# Patient Record
Sex: Female | Born: 1965 | Race: White | Hispanic: No | Marital: Married | Smoking: Never smoker
Health system: Southern US, Community
[De-identification: ages and names within clinical notes are randomized; demographics above are authoritative.]

## PROBLEM LIST (undated history)

## (undated) DIAGNOSIS — M1990 Unspecified osteoarthritis, unspecified site: Secondary | ICD-10-CM

## (undated) DIAGNOSIS — E039 Hypothyroidism, unspecified: Secondary | ICD-10-CM

## (undated) DIAGNOSIS — R112 Nausea with vomiting, unspecified: Secondary | ICD-10-CM

## (undated) DIAGNOSIS — C801 Malignant (primary) neoplasm, unspecified: Secondary | ICD-10-CM

## (undated) DIAGNOSIS — D649 Anemia, unspecified: Secondary | ICD-10-CM

## (undated) DIAGNOSIS — F32A Depression, unspecified: Secondary | ICD-10-CM

## (undated) DIAGNOSIS — I499 Cardiac arrhythmia, unspecified: Secondary | ICD-10-CM

## (undated) DIAGNOSIS — J45909 Unspecified asthma, uncomplicated: Secondary | ICD-10-CM

## (undated) DIAGNOSIS — Z87442 Personal history of urinary calculi: Secondary | ICD-10-CM

## (undated) DIAGNOSIS — F329 Major depressive disorder, single episode, unspecified: Secondary | ICD-10-CM

## (undated) DIAGNOSIS — E079 Disorder of thyroid, unspecified: Secondary | ICD-10-CM

## (undated) DIAGNOSIS — R519 Headache, unspecified: Secondary | ICD-10-CM

## (undated) DIAGNOSIS — F419 Anxiety disorder, unspecified: Secondary | ICD-10-CM

## (undated) DIAGNOSIS — T4145XA Adverse effect of unspecified anesthetic, initial encounter: Secondary | ICD-10-CM

## (undated) DIAGNOSIS — K219 Gastro-esophageal reflux disease without esophagitis: Secondary | ICD-10-CM

## (undated) DIAGNOSIS — G4730 Sleep apnea, unspecified: Secondary | ICD-10-CM

## (undated) DIAGNOSIS — R51 Headache: Secondary | ICD-10-CM

## (undated) DIAGNOSIS — Z9889 Other specified postprocedural states: Secondary | ICD-10-CM

## (undated) HISTORY — DX: Anxiety disorder, unspecified: F41.9

## (undated) HISTORY — DX: Unspecified asthma, uncomplicated: J45.909

## (undated) HISTORY — DX: Depression, unspecified: F32.A

## (undated) HISTORY — DX: Anemia, unspecified: D64.9

## (undated) HISTORY — PX: TONSILLECTOMY AND ADENOIDECTOMY: SHX28

## (undated) HISTORY — PX: BREAST SURGERY: SHX581

## (undated) HISTORY — DX: Disorder of thyroid, unspecified: E07.9

## (undated) HISTORY — PX: COSMETIC SURGERY: SHX468

## (undated) HISTORY — DX: Major depressive disorder, single episode, unspecified: F32.9

## (undated) HISTORY — PX: TONSILLECTOMY: SUR1361

## (undated) HISTORY — PX: ABDOMINAL HYSTERECTOMY: SHX81

---

## 1988-08-13 HISTORY — PX: BREAST IMPLANT REMOVAL: SUR1101

## 1998-06-07 ENCOUNTER — Other Ambulatory Visit: Admission: RE | Admit: 1998-06-07 | Discharge: 1998-06-07 | Payer: Self-pay | Admitting: Plastic Surgery

## 1998-08-13 HISTORY — PX: BREAST IMPLANT EXCHANGE: SHX6296

## 1999-03-14 ENCOUNTER — Other Ambulatory Visit: Admission: RE | Admit: 1999-03-14 | Discharge: 1999-03-14 | Payer: Self-pay | Admitting: Family Medicine

## 2000-03-19 ENCOUNTER — Other Ambulatory Visit: Admission: RE | Admit: 2000-03-19 | Discharge: 2000-03-19 | Payer: Self-pay | Admitting: Family Medicine

## 2001-03-31 ENCOUNTER — Other Ambulatory Visit: Admission: RE | Admit: 2001-03-31 | Discharge: 2001-03-31 | Payer: Self-pay | Admitting: Family Medicine

## 2002-03-19 ENCOUNTER — Emergency Department (HOSPITAL_COMMUNITY): Admission: EM | Admit: 2002-03-19 | Discharge: 2002-03-19 | Payer: Self-pay | Admitting: Emergency Medicine

## 2002-03-19 ENCOUNTER — Encounter: Payer: Self-pay | Admitting: Emergency Medicine

## 2002-03-23 ENCOUNTER — Emergency Department (HOSPITAL_COMMUNITY): Admission: EM | Admit: 2002-03-23 | Discharge: 2002-03-23 | Payer: Self-pay | Admitting: Emergency Medicine

## 2002-03-25 ENCOUNTER — Other Ambulatory Visit: Admission: RE | Admit: 2002-03-25 | Discharge: 2002-03-25 | Payer: Self-pay | Admitting: *Deleted

## 2002-03-27 ENCOUNTER — Encounter: Payer: Self-pay | Admitting: Family Medicine

## 2002-03-27 ENCOUNTER — Encounter: Admission: RE | Admit: 2002-03-27 | Discharge: 2002-03-27 | Payer: Self-pay | Admitting: Family Medicine

## 2003-03-02 ENCOUNTER — Emergency Department (HOSPITAL_COMMUNITY): Admission: AD | Admit: 2003-03-02 | Discharge: 2003-03-02 | Payer: Self-pay

## 2003-03-04 ENCOUNTER — Inpatient Hospital Stay (HOSPITAL_COMMUNITY): Admission: AD | Admit: 2003-03-04 | Discharge: 2003-03-05 | Payer: Self-pay | Admitting: Urology

## 2003-03-04 ENCOUNTER — Encounter: Payer: Self-pay | Admitting: Urology

## 2003-04-08 ENCOUNTER — Other Ambulatory Visit: Admission: RE | Admit: 2003-04-08 | Discharge: 2003-04-08 | Payer: Self-pay | Admitting: *Deleted

## 2004-12-08 ENCOUNTER — Emergency Department (HOSPITAL_COMMUNITY): Admission: EM | Admit: 2004-12-08 | Discharge: 2004-12-08 | Payer: Self-pay | Admitting: Emergency Medicine

## 2004-12-20 ENCOUNTER — Ambulatory Visit (HOSPITAL_COMMUNITY): Admission: RE | Admit: 2004-12-20 | Discharge: 2004-12-20 | Payer: Self-pay | Admitting: *Deleted

## 2005-01-24 ENCOUNTER — Ambulatory Visit (HOSPITAL_COMMUNITY): Admission: RE | Admit: 2005-01-24 | Discharge: 2005-01-24 | Payer: Self-pay | Admitting: Urology

## 2005-08-13 HISTORY — PX: ABDOMINAL EXPLORATION SURGERY: SHX538

## 2006-06-25 ENCOUNTER — Ambulatory Visit (HOSPITAL_COMMUNITY): Admission: RE | Admit: 2006-06-25 | Discharge: 2006-06-25 | Payer: Self-pay | Admitting: Gastroenterology

## 2006-06-28 ENCOUNTER — Ambulatory Visit (HOSPITAL_COMMUNITY): Admission: RE | Admit: 2006-06-28 | Discharge: 2006-06-28 | Payer: Self-pay | Admitting: Gastroenterology

## 2006-09-23 ENCOUNTER — Emergency Department (HOSPITAL_COMMUNITY): Admission: EM | Admit: 2006-09-23 | Discharge: 2006-09-24 | Payer: Self-pay | Admitting: Emergency Medicine

## 2007-03-29 ENCOUNTER — Emergency Department (HOSPITAL_COMMUNITY): Admission: EM | Admit: 2007-03-29 | Discharge: 2007-03-29 | Payer: Self-pay | Admitting: Emergency Medicine

## 2007-05-09 ENCOUNTER — Inpatient Hospital Stay (HOSPITAL_COMMUNITY): Admission: EM | Admit: 2007-05-09 | Discharge: 2007-05-12 | Payer: Self-pay | Admitting: Emergency Medicine

## 2007-08-12 ENCOUNTER — Emergency Department (HOSPITAL_COMMUNITY): Admission: EM | Admit: 2007-08-12 | Discharge: 2007-08-12 | Payer: Self-pay | Admitting: *Deleted

## 2007-10-25 IMAGING — CT CT ABDOMEN W/ CM
1 of 2 series · 15 of 32 positions shown, 19 images · IV contrast (omnipaque)
Comparison: None.

CLINICAL DATA: 41 year old female with abdominal pain. nausea, and vomiting.
ABDOMEN CT WITH CONTRAST ? 05/09/07:
TECHNIQUE: Multidetector CT imaging of the abdomen was performed following the standard protocol during bolus administration of intravenous contrast. 
Contrast:  100 ml Omnipaque 300 IV.
TECHNIQUE: Multidetector CT imaging of the pelvis was performed following the standard protocol during bolus administration of intravenous contrast.

[Series 2: abd_pel 5.0 b40f st · axial · 0.64mm/px · z∈[-456,-26]mm · 15 of 96 slices shown, 19 images]
[im 5/96  soft-tissue]
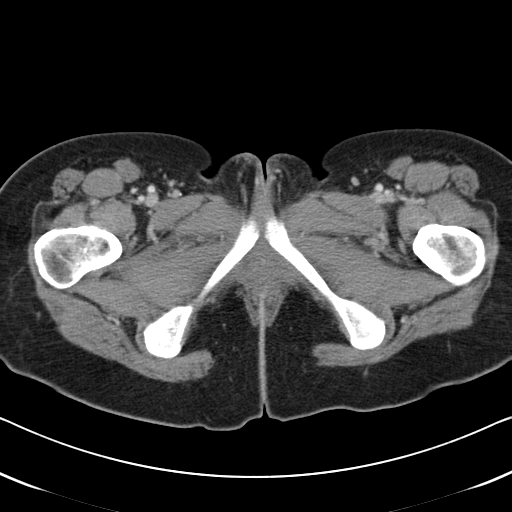
[im 5/96  bone]
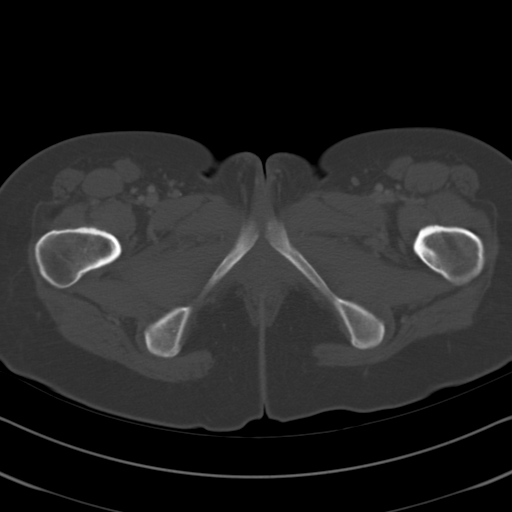
[im 13/96  soft-tissue]
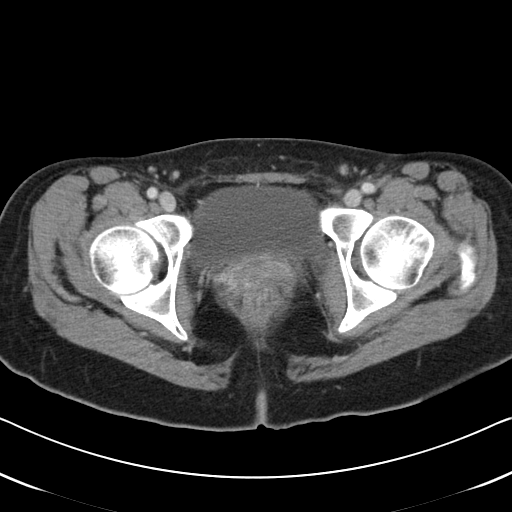
[im 22/96  soft-tissue]
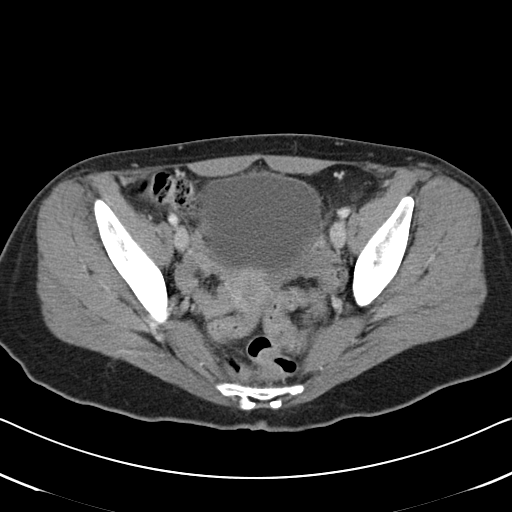
[im 26/96  soft-tissue]
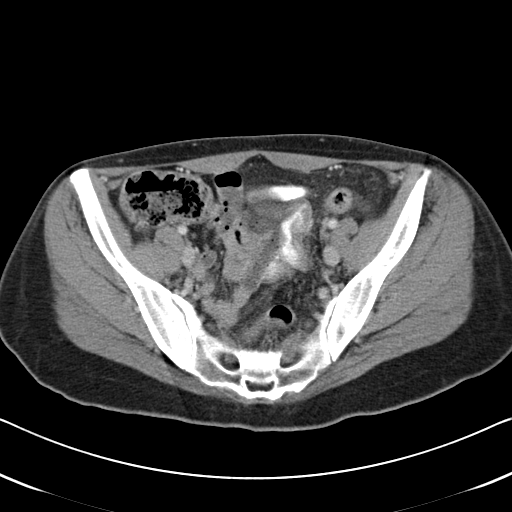
[im 35/96  soft-tissue]
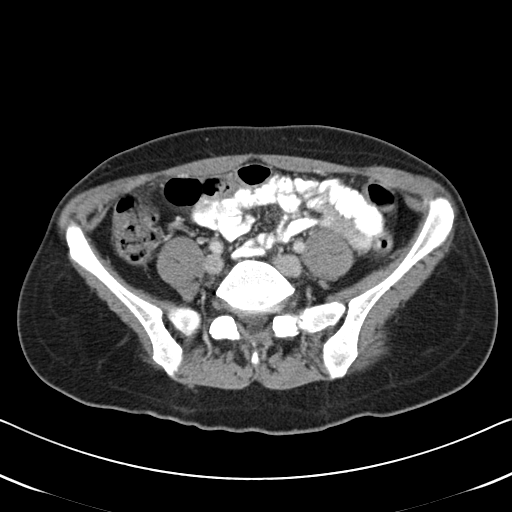
[im 39/96  soft-tissue]
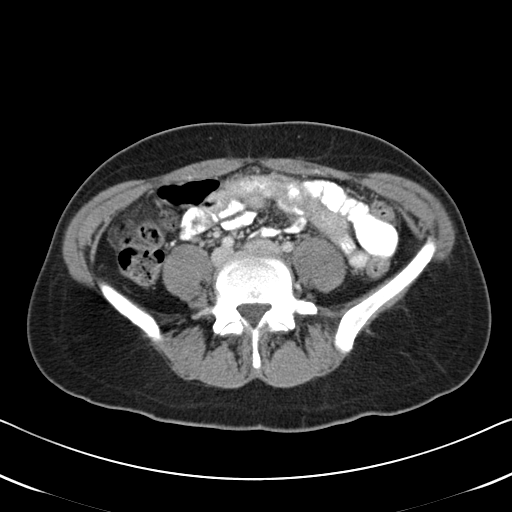
[im 48/96  soft-tissue]
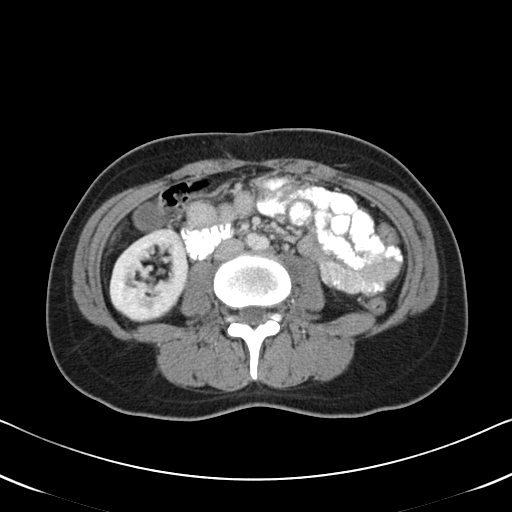
[im 57/96  soft-tissue]
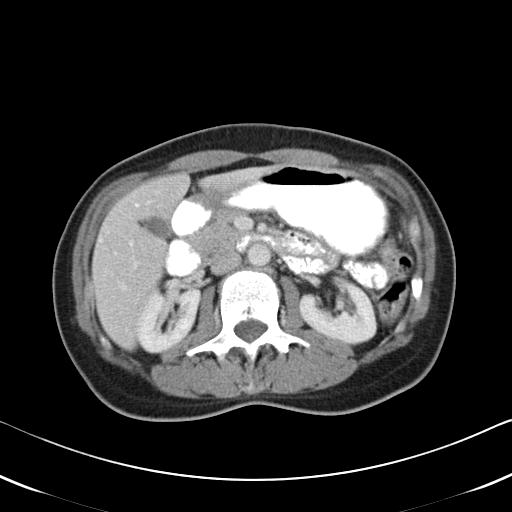
[im 61/96  soft-tissue]
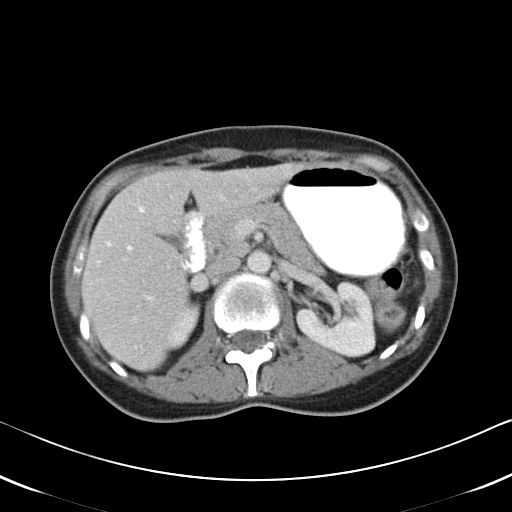
[im 61/96  bone]
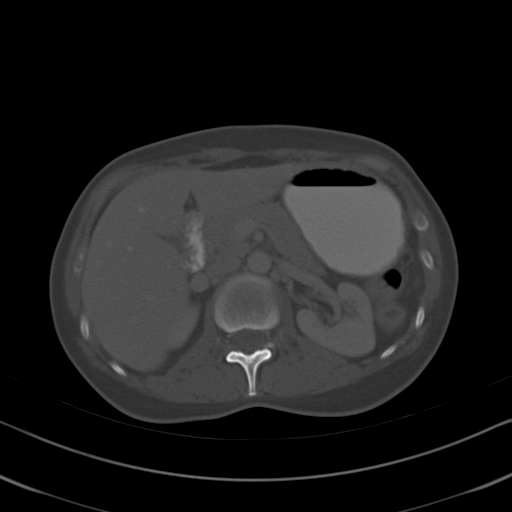
[im 70/96  soft-tissue]
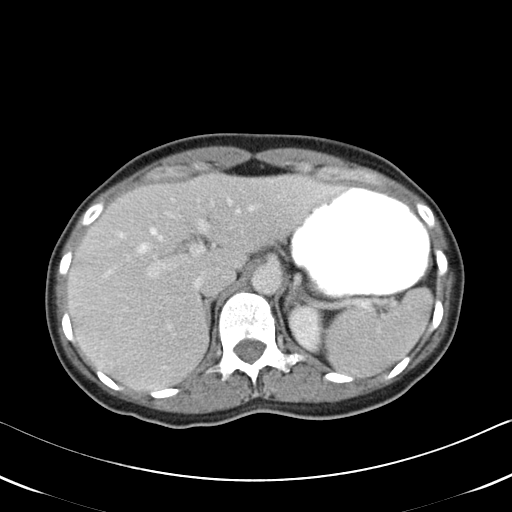
[im 74/96  soft-tissue]
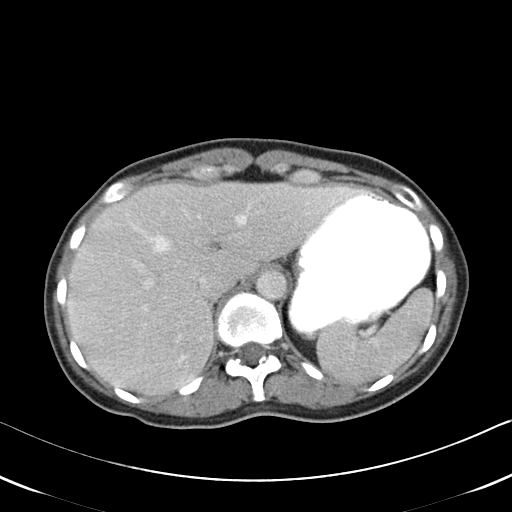
[im 78/96  lung]
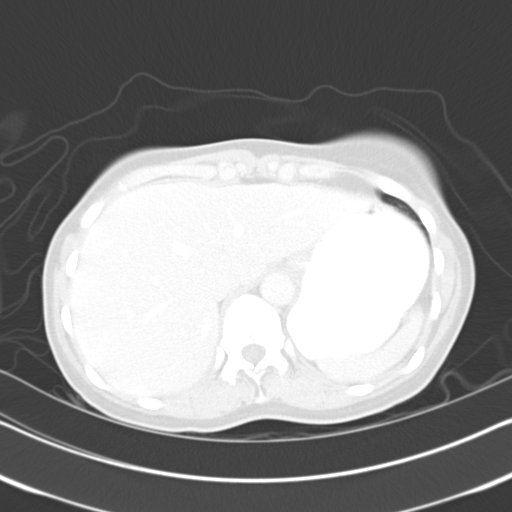
[im 83/96  soft-tissue]
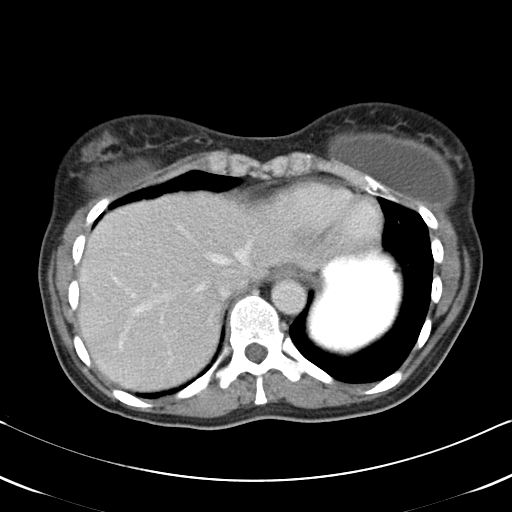
[im 83/96  lung]
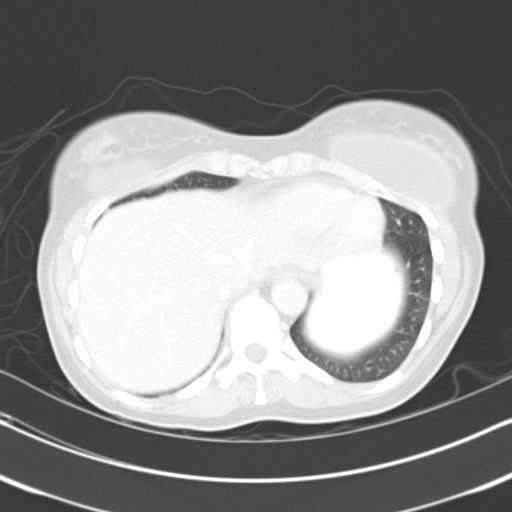
[im 87/96  lung]
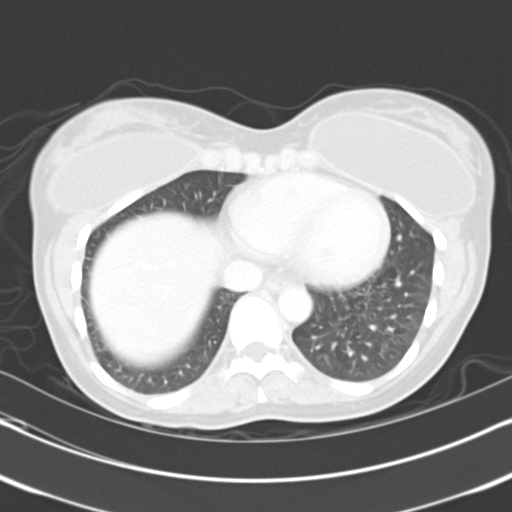
[im 91/96  soft-tissue]
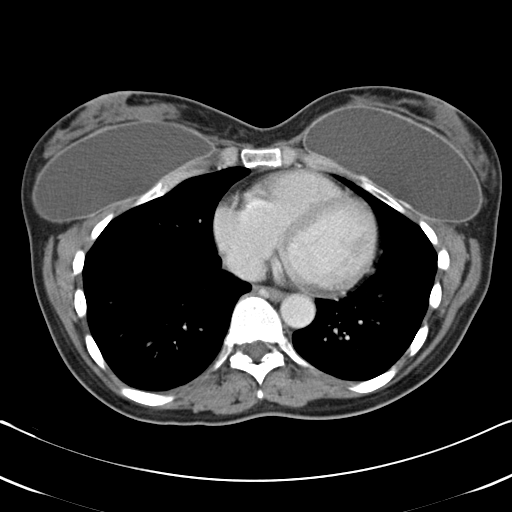
[im 91/96  lung]
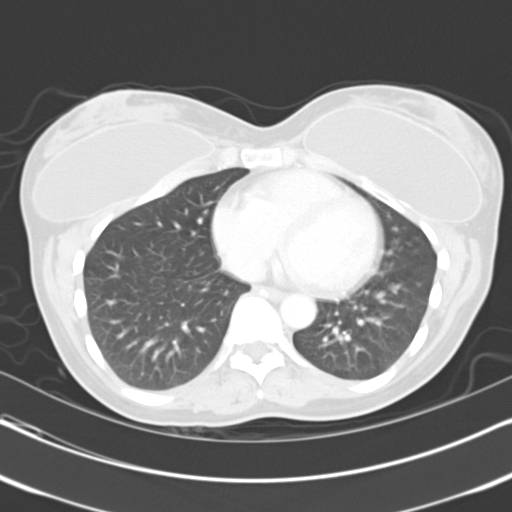

[15 of 32 positions shown; findings below may reference images not displayed]

FINDINGS: The lung bases are clear.  The heart size is normal.   The infused appearance of the liver is within normal limits.  The spleen is unremarkable.  The pancreas, common bile duct, and gallbladder are within normal limits.  The adrenal glands are unremarkable.  The kidneys are within normal limits on both the portal venous and delayed images.  There is no significant abdominal lymphadenopathy or free fluid.
Bone windows demonstrate minimal degenerative change.
IMPRESSION: 1.  No acute abnormality of the abdomen.
2.  Minimal degenerative change of the thoracolumbar spine with Schmorl's node at T12 and L1.
PELVIS CT WITH CONTRAST ? 05/09/07:
FINDINGS: The rectosigmoid colon is within normal limits. The uterus and adnexa are unremarkable. The urinary bladder is within normal limits.  There is mild wall thickening the small bowel without a focal lesion or obstruction.  This may represent inflammatory change or merely underdistention.
IMPRESSION: 1.  Mild nonspecific inflammatory change of the proximal small bowel.  Viral enteritis is not excluded.  
2.  No evidence for obstruction.
3.  No focal mass lesion.

## 2009-07-15 ENCOUNTER — Ambulatory Visit (HOSPITAL_COMMUNITY): Admission: RE | Admit: 2009-07-15 | Discharge: 2009-07-15 | Payer: Self-pay | Admitting: Internal Medicine

## 2010-09-04 ENCOUNTER — Other Ambulatory Visit: Payer: Self-pay | Admitting: Internal Medicine

## 2010-09-04 DIAGNOSIS — Z139 Encounter for screening, unspecified: Secondary | ICD-10-CM

## 2010-09-15 ENCOUNTER — Ambulatory Visit (HOSPITAL_COMMUNITY): Payer: Self-pay

## 2010-09-18 ENCOUNTER — Other Ambulatory Visit: Payer: Self-pay | Admitting: Internal Medicine

## 2010-09-18 DIAGNOSIS — Z1231 Encounter for screening mammogram for malignant neoplasm of breast: Secondary | ICD-10-CM

## 2010-09-18 DIAGNOSIS — Z1239 Encounter for other screening for malignant neoplasm of breast: Secondary | ICD-10-CM

## 2010-10-06 ENCOUNTER — Ambulatory Visit (HOSPITAL_COMMUNITY): Payer: Self-pay

## 2010-10-06 ENCOUNTER — Ambulatory Visit (HOSPITAL_COMMUNITY): Admission: RE | Admit: 2010-10-06 | Payer: Self-pay | Source: Ambulatory Visit

## 2010-10-31 ENCOUNTER — Ambulatory Visit (HOSPITAL_COMMUNITY)
Admission: RE | Admit: 2010-10-31 | Discharge: 2010-10-31 | Disposition: A | Discharge status: Home / Self Care | Payer: Self-pay | Source: Ambulatory Visit | Attending: Internal Medicine | Admitting: Internal Medicine

## 2010-10-31 DIAGNOSIS — Z1231 Encounter for screening mammogram for malignant neoplasm of breast: Secondary | ICD-10-CM | POA: Insufficient documentation

## 2010-12-26 NOTE — Op Note (Signed)
[name], Lynn White                 ACCOUNT NO.:  [id number]   MEDICAL RECORD NO.:  [id number]          PATIENT TYPE:  INP   LOCATION:  1432                         FACILITY:  [facility]   PHYSICIAN:  Anselmo Rod, M.D.  DATE OF BIRTH:  Jun 03, 1966   DATE OF PROCEDURE:  05/11/2007  DATE OF DISCHARGE:                               OPERATIVE REPORT   PROCEDURE PERFORMED:  Esophagogastroduodenoscopy.   ENDOSCOPIST:  Anselmo Rod, M.D..   INSTRUMENT USED:  Pentax video panendoscope.   INDICATIONS FOR PROCEDURE:  A 45 year old white female with history of  severe epigastric pain, undergoing EGD to rule out peptic ulcer disease,  esophagitis, gastritis, etc.   PREPROCEDURE PREPARATION:  Informed consent was procured from the  patient.  The patient was fasted for 8 hours prior to the procedure.  The risks and benefits of the procedure were discussed with the patient  in great detail.   PREPROCEDURE PHYSICAL:  The patient had stable vital signs.  NECK:  Supple.  CHEST:  Clear to auscultation.  S1, S2 regular.  ABDOMEN:  Soft with normal bowel sounds.   DESCRIPTION OF PROCEDURE:  The patient was placed in the left lateral  decubitus position and sedated with 100 mcg of Fentanyl and 6 mg of  Versed, given intravenously in slow incremental doses. Once the patient  was adequately sedated and maintained on low-flow oxygen and continuous  cardiac monitoring, the Pentax video panendoscope was advanced through  the mouthpiece over the tongue and into the esophagus under direct  vision. The entire esophagus was widely patent with no evidence of ring,  stricture, mass, esophagitis or Barrett's mucosa. The vocal cords  appeared normal.  The Z-line appeared healthy. The scope was then  advanced into the stomach. Retroflexion of the high cardia revealed no  abnormalities.  The mid body appeared normal.  Mild antral gastritis was  noted. The proximal small bowel appeared normal.  There was no  obvious  obstruction.   IMPRESSION:  Mild antral gastritis, otherwise normal EGD.   RECOMMENDATIONS:  1. Continue PPIs.  2. Outpatient follow-up with me within the next week.      Anselmo Rod, M.D.  Electronically Signed     JNM/MEDQ  D:  05/11/2007  T:  05/11/2007  Job:  [id number]   cc:   Milagros Evener, M.D.  Fax: [phone]

## 2010-12-26 NOTE — Discharge Summary (Signed)
Lynn White, WELLEN                 ACCOUNT NO.:  [id number]   MEDICAL RECORD NO.:  [id number]          PATIENT TYPE:  INP   LOCATION:  1432                         FACILITY:  [facility]   PHYSICIAN:  Madaline Savage, MD        DATE OF BIRTH:  1966-02-19   DATE OF ADMISSION:  05/09/2007  DATE OF DISCHARGE:  05/13/2007                               DISCHARGE SUMMARY   PRIMARY CARE PHYSICIAN:  None.   FINAL DISCHARGE DIAGNOSES:  1. Abdominal pain of uncertain etiology.  2. Generalized anxiety.  3. Hypothyroidism.  4. Chronic pain syndrome.   DISCHARGE MEDICATIONS:  1. Levothyroxine 100 mcg once daily.  2. Protonix 40 mg once daily.  3. Align 1 tablet daily.  4. Klonopin 1 mg every 6 hours as needed.  5. Propranolol 20 mg 3 times daily as needed.  6. Tylenol 500 mg 3 times daily as needed.   HISTORY OF PRESENT ILLNESS:  For the full history and physical, see the  history and physical dictated by Dr. Renae Gloss on May 09, 2007.  In  short, Lynn White is a 45 year old lady with several-year history of  severe abdominal pain.  She has been evaluated multiple times by her gyn  doctor, and she had a flexible sigmoidoscopy in 2006 which was  unremarkable.  She now comes in with worsening abdominal pain for  further evaluation and management.   PROCEDURES PERFORMED IN THE HOSPITAL:  1. She had an EGD done by Dr. Loreta Ave on May 11, 2007 which showed      mild antral gastritis, otherwise normal EGD.  2. She had CT of the abdomen and pelvis done on May 09, 2007,      which showed no acute abnormality of the abdomen, mild nonspecific      inflammatory change of the proximal small bowel, viral enteritis      not excluded.  3. She had an ultrasound abdomen done on May 09, 2007 which      showed no acute abnormality.   PROBLEM LIST:  1. Abdominal pain.  Lynn White has abdominal pain for years which has      been evaluated in the past.  This time, we admitted her for further    evaluation and management, and started treating her with IV fluids      and pain medications.  She has never had an EGD.  We consulted Dr.      Loreta Ave for an EGD and an EGD was done which showed no abnormality      except antral gastritis.  The plan is to continue her on proton      pump inhibitor at this time.  She also had a CT of the abdomen and      pelvis which showed questionable enteritis.  Dr. Loreta Ave does not      think this a true finding at this time.  She also had an ultrasound      abdomen which was unremarkable.  Her lipase was within normal      limits.  Her urine HCG was  negative.  At this time, Dr. Loreta Ave is      thinking this is probably a functional abdominal pain, and is      recommending that she follow up with her psychiatrist, Dr. Evelene Croon, in      1 week.  2. Hypothyroidism.  She is on thyroid replacement, and we did a TSH on      her which was elevated at 5.8.  I increased her Synthroid from 75      mcg to 100 mcg.  She will need a followup TSH in 4-6 weeks.   DISPOSITION:  She will be discharged home in a stable condition.   FOLLOWUP:  She is asked to follow up with Dr. Loreta Ave in 2 weeks and also  Dr. Evelene Croon in 1 week.      Madaline Savage, MD  Electronically Signed     PKN/MEDQ  D:  05/12/2007  T:  05/12/2007  Job:  [id number]

## 2010-12-26 NOTE — H&P (Signed)
[name], [name]                 ACCOUNT NO.:  [id number]   MEDICAL RECORD NO.:  [id number]          PATIENT TYPE:  INP   LOCATION:  1432                         FACILITY:  [facility]   PHYSICIAN:  Merlene Laughter. Renae Gloss, M.D.DATE OF BIRTH:  [birth year]   DATE OF ADMISSION:  05/09/2007  DATE OF DISCHARGE:                              HISTORY & PHYSICAL   CHIEF COMPLAINT:  Severe abdominal pain.   HISTORY OF PRESENT ILLNESS:  [name] is a 45 year old lady with a  several-year history of severe abdominal pain.  This has been evidently  evaluated several times in the past per her GYN physician, Dr. Marina Gravel, who performed a laparoscopic procedure on her in 2006, which was  unremarkable for endometriosis.  She also appears to have had a limited  GI evaluation last year including a flexible sigmoidoscopy per Dr. Loreta Ave,  which was unremarkable except hemorrhoids..  In the event, she continues  to have such significant abdominal pain that she has been unable to work  and has, in fact, lost her job last year due to days absent as a result  of her abdominal symptoms.  She states that she had been taking Tylenol  for pain which, of course, has not been helpful, but 2 weeks ago her  mother-in-law died and she found in her possession some morphine 15 mg,  which she has been taking as well.  She also states the morphine has not  been helpful.  However, she has noted constipation from taking this  narcotic.  She has mild nausea but no vomiting.  She appears to be  tolerating solids and liquids without complications.  She denies fever  or chills.   She has no primary care physician and she has no health insurance since  she lost her job a year ago.  However, she is followed by her  gynecologist, Dr. Marina Gravel, and her psychiatrist, Dr. Milagros Evener.   PAST MEDICAL HISTORY:  1. Generalized anxiety.  2. Hypothyroidism.  3. Chronic abdominal pain, unclear etiology.   FAMILY HISTORY:   Significant for breast cancer in mother, who is a 34-  year survivor.   SOCIAL HISTORY:  Currently unemployed.  She denies tobacco or drugs of  abuse.  She has an occasional glass of one.   DRUG ALLERGIES:  No known drug allergies.   MEDICATIONS:  1. Klonopin 1 mg p.o. t.i.d.  2. Lyre daily as directed.  3. Propranolol 20 mg p.o. t.i.d. p.r.n. for anxiety.  4. Align p.r.n.  5. Levothyroxine 75 mcg p.o. daily.   REVIEW OF SYSTEMS:  As per HPI.  Otherwise negative.  Greater than 10  systems were reviewed.   PHYSICAL EXAMINATION:  GENERAL APPEARANCE:  A well-developed, well-  nourished female complaining of abdominal pain.  Temperature 98.6, pulse 75, respirations 18, blood pressure 98/65, O2  saturation on room air 98%.  HEENT: No oropharyngeal lesions.  NECK:  Supple.  No masses.  Carotids 2+, no bruits.  LUNGS:  Clear to auscultation bilaterally.  HEART:  S1-S2, regular rate and rhythm.  No murmurs, rubs or  gallops.  ABDOMEN:  Soft, diffuse tenderness but especially in the epigastric  area.  No rebound tenderness or guarding.  She has normoactive bowel  sounds.  EXTREMITIES:  No clubbing, cyanosis or edema.  Pulses 2+ throughout.  NEUROLOGIC:  Alert and oriented x3.  Cranial nerves intact.   LABORATORY DATA:  Lipase 45, 31, 39.  Potassium 3.7.  BUN 8, creatinine  0.84.  Liver function tests are normal.  WBC 5.7, hemoglobin 11.7,  platelet 306.  Urine pregnancy test is negative.  Abdominal ultrasound  is negative for gallstones.  Abdominal-pelvic CT scan shows mild  inflammatory change in the proximal small bowel, questionable for viral  enteritis.   ASSESSMENT/PLAN:  Abdominal pain.  The etiology is unclear, however may  be due to the CT findings of viral enteritis.  Therefore, we will  continue supportive management with IV fluids and pain control.  Given  that she has had such a chronic symptomatology, she will require further  GI evaluation during this admission.  Other  etiologies to consider would  include peptic ulcer disease, severe GERD, biliary disease.           ______________________________  Merlene Laughter. Renae Gloss, M.D.     KRS/MEDQ  D:  05/09/2007  T:  05/10/2007  Job:  [id number]

## 2010-12-29 NOTE — Op Note (Signed)
Lynn White, Lynn White                 ACCOUNT NO.:  [id number]   MEDICAL RECORD NO.:  [id number]          PATIENT TYPE:  AMB   LOCATION:  ENDO                         FACILITY:  [facility]   PHYSICIAN:  Anselmo Rod, M.D.  DATE OF BIRTH:  September 12, 1965   DATE OF PROCEDURE:  06/28/2006  DATE OF DISCHARGE:                                 OPERATIVE REPORT   PROCEDURE PERFORMED:  Flexible sigmoidoscopy to 90 cm.   ENDOSCOPIST:  Anselmo Rod, M.D.   INSTRUMENT USED:  Olympus video colonoscope.   INDICATIONS FOR PROCEDURE:  45 year old white female with a history of  severe left lower quadrant pain undergoing a flexible sigmoidoscopy.  The  patient had worsening constipation the recent past, rule out colonic polyps,  masses, etc.   PREPROCEDURE PREPATION:  Informed consent was procured from the patient.  The patient fasted for eight hours prior to the procedure and prepped with a  gallon of Trilyte the night prior to the procedure.  Risks and benefits of  the procedure including a 10% miss rate of cancer and polyp were discussed  with the patient as well.   PREPROCEDURE PHYSICAL:  Patient had stable vital signs.  Neck supple, chest  clear to auscultation.  S1, S2 regular.  Abdomen soft with severe left lower  quadrant tenderness on palpation with guarding, no rebound or rigidity.   DESCRIPTION OF PROCEDURE:  The patient was placed in the left lateral  decubitus position and sedated with 100 mcg of fentanyl and 10 mg of Versed  along with a 12.5 mg of Phenergan.  Once the patient was adequately sedated  and maintained on low-flow oxygen and continuous cardiac monitoring, the  Olympus video colonoscope was advanced from the rectum to 90 cm without  difficulty.  The patient had significant abdominal discomfort with  insufflation of air into the colon indicating a component of visceral  hypersensitivity consistent with ISB.  Small internal hemorrhoids seen on  retroflexion.  The rest of exam  was unremarkable.  No masses or polyps  identified. There was no evidence of diverticulosis.   IMPRESSION:  1. Small nonbleeding internal hemorrhoids otherwise normal exam to 90 cm.  2. Visceral hypersensitivity appreciated with insufflation of air into the      colon indicating a component of IBS.   RECOMMENDATIONS:  1. Continue high fiber diet with liberal fluid intake.  2. Outpatient follow-up in the next 2 weeks for further recommendations.  3. Baseline screening colonoscopy is advised at age 36.      Anselmo Rod, M.D.  Electronically Signed     JNM/MEDQ  D:  07/01/2006  T:  07/01/2006  Job:  [id number]   cc:   Gerri Spore B. Earlene Plater, M.D.

## 2010-12-29 NOTE — Op Note (Signed)
Lynn White, Lynn White                 ACCOUNT NO.:  [id number]   MEDICAL RECORD NO.:  [id number]          PATIENT TYPE:  AMB   LOCATION:  SDC                           FACILITY:  [facility]   PHYSICIAN:  Nash B. Earlene Plater, M.D.  DATE OF BIRTH:  [birth year]   DATE OF PROCEDURE:  12/20/2004  DATE OF DISCHARGE:                                 OPERATIVE REPORT   PREOPERATIVE DIAGNOSES:  Pelvic pain and dysmenorrhea.   POSTOPERATIVE DIAGNOSES:  Pelvic pain and dysmenorrhea.   PROCEDURE:  Open diagnostic laparoscopy.   SURGEON:  Chester Holstein. Earlene Plater, M.D.   ASSISTANT:  None.   ANESTHESIA:  General.   ESTIMATED BLOOD LOSS:  Minimal.   COMPLICATIONS:  None.   FINDINGS:  Normal-appearing uterus, tubes, ovaries, pelvis and upper  abdomen.  The appendix was not well seen and presumed to be retrocecal.   INDICATIONS:  The patient with a greater than one year history of pelvic  pain and dysmenorrhea which has been severe.  She has been to the emergency  room on one occasion recently and needed oral Dilaudid to control her pain.  Recent pelvic ultrasound showed no abnormality.  Given the severe and  persistent nature of her pain, the patient presents for a diagnostic  laparoscopy and potential Lynn White dioxide laser vaporization of endometriosis  if identified.  The patient was advised of an approximately 30% chance of  identifying the endometriosis, a 30% chance of no pathology, 30% chance of  adhesive disease or other abnormality.  The risks of surgery were discussed  included infection, bleeding or damage to surrounding organs.   DESCRIPTION OF PROCEDURE:  The patient was taken to the operating room and  general anesthesia was obtained.  She was placed in the Kihei stirrups and  prepped and draped in the standard fashion.  The Foley catheter was inserted  into the bladder.  The speculum was inserted.  A single-toothed tenaculum  was attached to the anterior lip of the cervix.  The cervical stenosis  was  encountered and would not allow passage of the Hulka tenaculum or a dilator.  Therefore, the single-toothed was left attached for uterine manipulation.   The 10 mm incision was placed at the umbilicus and carried sharply to the  fascia.  The fascia was divided sharply and elevated with Kocher clamps.  The posterior sheath and peritoneum were elevated with an Allis clamp and  entered sharply.  A pursestring suture of 0 Vicryl was placed around the  fascial defect.  The Hasson cannula was inserted and secured.  The  pneumoperitoneum was obtained with CO2 gas.  The Trendelenburg position was  obtained.  Bowel mobilized superiorly with blunt probes through the  operating scope.  The pelvis and abdomen were inspected with the above  findings noted.  Given no pathology was found, the procedure was terminated.   The scope was removed and gas released.  The abdominal wall was elevated  with a [employer] retractor and the previously placed pursestring suture  snugged down.  There was an approximately 0.25 cm defect in the fascia at  the inferior aspect.  With the abdominal wall elevated with the previously  placed suture, a superficial stitch was placed through the defect to  reapproximate it.  The skin was closed with subcuticular 4-0 Vicryl.  The  single-toothed  was removed and the cervix hemostatic.   The patient tolerated the procedure well and there were no complications.  She was taken to the recovery room awake, alert and in stable condition.      WBD/MEDQ  D:  12/20/2004  T:  12/20/2004  Job:  [id number]

## 2011-05-18 LAB — COMPREHENSIVE METABOLIC PANEL
BUN: 4 — ABNORMAL LOW
CO2: 26
GFR calc non Af Amer: >60
Potassium: 3.8
Sodium: 138
Total Bilirubin: 0.5

## 2011-05-18 LAB — URINALYSIS, ROUTINE W REFLEX MICROSCOPIC
Bilirubin Urine: NEGATIVE
Glucose, UA: NEGATIVE
Ketones, ur: NEGATIVE
Leukocytes, UA: NEGATIVE
Protein, ur: NEGATIVE
Specific Gravity, Urine: 1.008

## 2011-05-18 LAB — URINE MICROSCOPIC-ADD ON

## 2011-05-18 LAB — RAPID URINE DRUG SCREEN, HOSP PERFORMED
Amphetamines: NOT DETECTED
Barbiturates: NOT DETECTED
Tetrahydrocannabinol: NOT DETECTED

## 2011-05-18 LAB — TRICYCLICS SCREEN, URINE: TCA Scrn: POSITIVE — AB

## 2011-05-24 LAB — CBC
HCT: 32.3 — ABNORMAL LOW
MCHC: 34.7
Platelets: 276
Platelets: 279
RBC: 3.62 — ABNORMAL LOW
RBC: 3.66 — ABNORMAL LOW
RDW: 12.8
RDW: 13
RDW: 13.1
WBC: 5.7
WBC: 7.5

## 2011-05-24 LAB — COMPREHENSIVE METABOLIC PANEL
ALT: 18
Albumin: 3.3 — ABNORMAL LOW
Alkaline Phosphatase: 66
Calcium: 9.2
Creatinine, Ser: 0.84
GFR calc Af Amer: >60
GFR calc non Af Amer: >60
Total Bilirubin: 0.4

## 2011-05-24 LAB — DIFFERENTIAL
Basophils Relative: 0
Monocytes Absolute: 0.2
Monocytes Absolute: 0.3
Monocytes Relative: 4
Monocytes Relative: 4
Neutro Abs: 2.7
Neutro Abs: 4.3
Neutrophils Relative %: 47
Neutrophils Relative %: 63

## 2011-05-24 LAB — BASIC METABOLIC PANEL
BUN: 1 — ABNORMAL LOW
CO2: 27
Chloride: 107
Potassium: 3.8

## 2011-05-24 LAB — URINALYSIS, ROUTINE W REFLEX MICROSCOPIC
Ketones, ur: NEGATIVE
Leukocytes, UA: NEGATIVE
Protein, ur: NEGATIVE

## 2011-05-24 LAB — URINE MICROSCOPIC-ADD ON

## 2011-05-25 LAB — URINALYSIS, ROUTINE W REFLEX MICROSCOPIC
Nitrite: NEGATIVE
Protein, ur: NEGATIVE
Specific Gravity, Urine: 1.02
Urobilinogen, UA: 0.2

## 2011-05-25 LAB — URINE CULTURE

## 2011-05-25 LAB — PREGNANCY, URINE: Preg Test, Ur: NEGATIVE

## 2011-05-25 LAB — COMPREHENSIVE METABOLIC PANEL
Albumin: 3.4 — ABNORMAL LOW
Alkaline Phosphatase: 52
BUN: 8
Calcium: 9.3
Chloride: 109
Creatinine, Ser: 0.83
GFR calc Af Amer: >60
GFR calc non Af Amer: >60

## 2011-05-25 LAB — URINE MICROSCOPIC-ADD ON

## 2011-05-25 LAB — DIFFERENTIAL
Basophils Relative: 0
Eosinophils Absolute: 0.2
Eosinophils Relative: 2
Monocytes Relative: 3
Neutrophils Relative %: 63

## 2011-05-25 LAB — CBC
HCT: 34.7 — ABNORMAL LOW
MCHC: 34.7
MCV: 93.3
Platelets: 283
RDW: 14.2 — ABNORMAL HIGH
WBC: 6.6

## 2011-10-19 ENCOUNTER — Ambulatory Visit (INDEPENDENT_AMBULATORY_CARE_PROVIDER_SITE_OTHER): Payer: Self-pay | Admitting: Family Medicine

## 2011-10-19 ENCOUNTER — Encounter: Payer: Self-pay | Admitting: Family Medicine

## 2011-10-19 DIAGNOSIS — E039 Hypothyroidism, unspecified: Secondary | ICD-10-CM

## 2011-10-19 DIAGNOSIS — N926 Irregular menstruation, unspecified: Secondary | ICD-10-CM

## 2011-10-19 DIAGNOSIS — N951 Menopausal and female climacteric states: Secondary | ICD-10-CM

## 2011-10-19 DIAGNOSIS — R1013 Epigastric pain: Secondary | ICD-10-CM

## 2011-10-19 LAB — POCT URINE PREGNANCY: Preg Test, Ur: NEGATIVE

## 2011-10-19 LAB — COMPREHENSIVE METABOLIC PANEL
ALT: <8 U/L (ref 0–35)
AST: 14 U/L (ref 0–37)
CO2: 27 mEq/L (ref 19–32)
Calcium: 9.4 mg/dL (ref 8.4–10.5)
Chloride: 103 mEq/L (ref 96–112)
Creat: 0.94 mg/dL (ref 0.50–1.10)
Sodium: 138 mEq/L (ref 135–145)
Total Protein: 6.8 g/dL (ref 6.0–8.3)

## 2011-10-19 LAB — CBC WITH DIFFERENTIAL/PLATELET
Eosinophils Absolute: 0.2 10*3/uL (ref 0.0–0.7)
Hemoglobin: 11.9 g/dL — ABNORMAL LOW (ref 12.0–15.0)
Lymphocytes Relative: 30 % (ref 12–46)
Lymphs Abs: 2.4 10*3/uL (ref 0.7–4.0)
MCH: 30.7 pg (ref 26.0–34.0)
Monocytes Relative: 6 % (ref 3–12)
Neutro Abs: 4.8 10*3/uL (ref 1.7–7.7)
Neutrophils Relative %: 61 % (ref 43–77)
Platelets: 351 10*3/uL (ref 150–400)
RBC: 3.88 MIL/uL (ref 3.87–5.11)
WBC: 7.8 10*3/uL (ref 4.0–10.5)

## 2011-10-19 LAB — POCT URINALYSIS DIPSTICK
Bilirubin, UA: NEGATIVE
Glucose, UA: NEGATIVE
Ketones, UA: NEGATIVE
Leukocytes, UA: NEGATIVE
Nitrite, UA: NEGATIVE

## 2011-10-19 LAB — POCT UA - MICROSCOPIC ONLY
Casts, Ur, LPF, POC: NEGATIVE
Crystals, Ur, HPF, POC: NEGATIVE
Mucus, UA: NEGATIVE

## 2011-10-19 LAB — TSH: TSH: 5.347 u[IU]/mL — ABNORMAL HIGH (ref 0.350–4.500)

## 2011-10-19 MED ORDER — ONDANSETRON HCL 4 MG PO TABS: 4.0000 mg | ORAL_TABLET | Freq: Three times a day (TID) | ORAL | Status: AC | PRN

## 2011-10-19 MED ORDER — OMEPRAZOLE 20 MG PO CPDR: 20.0000 mg | DELAYED_RELEASE_CAPSULE | Freq: Every day | ORAL | Status: DC

## 2011-10-19 NOTE — Patient Instructions (Signed)
Anxiety and Panic Attacks Your caregiver has informed you that you are having an anxiety or panic attack. There may be many forms of this. Most of the time these attacks come suddenly and without warning. They come at any time of day, including periods of sleep, and at any time of life. They may be strong and unexplained. Although panic attacks are very scary, they are physically harmless. Sometimes the cause of your anxiety is not known. Anxiety is a protective mechanism of the body in its fight or flight mechanism. Most of these perceived danger situations are actually nonphysical situations (such as anxiety over losing a job). CAUSES  The causes of an anxiety or panic attack are many. Panic attacks may occur in otherwise healthy people given a certain set of circumstances. There may be a genetic cause for panic attacks. Some medications may also have anxiety as a side effect. SYMPTOMS  Some of the most common feelings are:  Intense terror.   Dizziness, feeling faint.   Hot and cold flashes.   Fear of going crazy.   Feelings that nothing is real.   Sweating.   Shaking.   Chest pain or a fast heartbeat (palpitations).   Smothering, choking sensations.   Feelings of impending doom and that death is near.   Tingling of extremities, this may be from over-breathing.   Altered reality (derealization).   Being detached from yourself (depersonalization).  Several symptoms can be present to make up anxiety or panic attacks. DIAGNOSIS  The evaluation by your caregiver will depend on the type of symptoms you are experiencing. The diagnosis of anxiety or panic attack is made when no physical illness can be determined to be a cause of the symptoms. TREATMENT  Treatment to prevent anxiety and panic attacks may include:  Avoidance of circumstances that cause anxiety.   Reassurance and relaxation.   Regular exercise.   Relaxation therapies, such as yoga.   Psychotherapy with a  psychiatrist or therapist.   Avoidance of caffeine, alcohol and illegal drugs.   Prescribed medication.  SEEK IMMEDIATE MEDICAL CARE IF:   You experience panic attack symptoms that are different than your usual symptoms.   You have any worsening or concerning symptoms.  Document Released: 07/30/2005 Document Revised: 07/19/2011 Document Reviewed: 12/01/2009 [facility] Patient Information 2012 [place], [place]     .Gastritis Gastritis is an inflammation (the body's way of reacting to injury and/or infection) of the stomach. It is often caused by viral or bacterial (germ) infections. It can also be caused by chemicals (including alcohol) and medications. This illness may be associated with generalized malaise (feeling tired, not well), cramps, and fever. The illness may last 2 to 7 days. If symptoms of gastritis continue, gastroscopy (looking into the stomach with a telescope-like instrument), biopsy (taking tissue samples), and/or blood tests may be necessary to determine the cause. Antibiotics will not affect the illness unless there is a bacterial infection present. One common bacterial cause of gastritis is an organism known as H. Pylori. This can be treated with antibiotics. Other forms of gastritis are caused by too much acid in the stomach. They can be treated with medications such as H2 blockers and antacids. Home treatment is usually all that is needed. Young children will quickly become dehydrated (loss of body fluids) if vomiting and diarrhea are both present. Medications may be given to control nausea. Medications are usually not given for diarrhea unless especially bothersome. Some medications slow the removal of the virus from the gastrointestinal  tract. This slows down the healing process. HOME CARE INSTRUCTIONS Home care instructions for nausea and vomiting:  For adults: drink small amounts of fluids often. Drink at least 2 quarts a day. Take sips frequently. Do not drink large  amounts of fluid at one time. This may worsen the nausea.   Only take over-the-counter or prescription medicines for pain, discomfort, or fever as directed by your caregiver.   Drink clear liquids only. Those are anything you can see through such as water, broth, or soft drinks.   Once you are keeping clear liquids down, you may start full liquids, soups, juices, and ice cream or sherbet. Slowly add bland (plain, not spicy) foods to your diet.  Home care instructions for diarrhea:  Diarrhea can be caused by bacterial infections or a virus. Your condition should improve with time, rest, fluids, and/or anti-diarrheal medication.   Until your diarrhea is under control, you should drink clear liquids often in small amounts. Clear liquids include: water, broth, jell-o water and weak tea.  Avoid:  Milk.   Fruits.   Tobacco.   Alcohol.   Extremely hot or cold fluids.   Too much intake of anything at one time.  When your diarrhea stops you may add the following foods, which help the stool to become more formed:  Rice.   Bananas.   Apples without skin.   Dry toast.  Once these foods are tolerated you may add low-fat yogurt and low-fat cottage cheese. They will help to restore the normal bacterial balance in your bowel. Wash your hands well to avoid spreading bacteria (germ) or virus. SEEK IMMEDIATE MEDICAL CARE IF:   You are unable to keep fluids down.   Vomiting or diarrhea become persistent (constant).   Abdominal pain develops, increases, or localizes. (Right sided pain can be appendicitis. Left sided pain in adults can be diverticulitis.)   You develop a fever (an oral temperature above 102 F (38.9 C)).   Diarrhea becomes excessive or contains blood or mucus.   You have excessive weakness, dizziness, fainting or extreme thirst.   You are not improving or you are getting worse.   You have any other questions or concerns.  Document Released: 07/24/2001 Document  Revised: 07/19/2011 Document Reviewed: 07/30/2005 [facility] Patient Information 2012 [place], [place].

## 2011-10-21 ENCOUNTER — Encounter: Payer: Self-pay | Admitting: Family Medicine

## 2011-10-21 DIAGNOSIS — N951 Menopausal and female climacteric states: Secondary | ICD-10-CM | POA: Insufficient documentation

## 2011-10-21 DIAGNOSIS — F4310 Post-traumatic stress disorder, unspecified: Secondary | ICD-10-CM | POA: Insufficient documentation

## 2011-10-21 DIAGNOSIS — E039 Hypothyroidism, unspecified: Secondary | ICD-10-CM | POA: Insufficient documentation

## 2011-10-21 NOTE — Progress Notes (Signed)
  Subjective:    Patient ID: Lynn White, female    DOB: Sep 14, 1965, 46 y.o.   MRN: [id number]  HPI  This 46 y.o. Cauc female presents with c/o epig pain presents for several months. She has had nausea but  no vomiting and Tramadol is not effective. She also reports palpitations but no chest pain, dizziness or  diaphoresis. She wonders if stress could be contributing to her symptoms. She has no hx of Peptic Ulcer Disease.  She is a nonsmoker.  She also reports that she has had some spotting though she takes continuous OCP.  Discontinuing OCP is not an option unless her partner "gets a vasectomy".    Review of Systems  Constitutional: Positive for appetite change. Negative for fever and diaphoresis.  Respiratory: Negative for chest tightness and shortness of breath.   Cardiovascular: Positive for palpitations. Negative for chest pain.  Gastrointestinal: Positive for nausea, abdominal pain and constipation. Negative for vomiting, blood in stool, abdominal distention and anal bleeding.  Genitourinary: Negative.   Skin: Positive for rash.  Neurological: Negative.   All other systems reviewed and are negative.       Objective:   Physical Exam  Vitals reviewed. Constitutional: She is oriented to person, place, and time. She appears well-developed and well-nourished.       Mildly distressed  HENT:  Head: Normocephalic and atraumatic.  Right Ear: External ear normal.  Left Ear: External ear normal.  Nose: Nose normal.  Mouth/Throat: Oropharynx is clear and moist.  Eyes: Conjunctivae and EOM are normal. No scleral icterus.  Neck: Normal range of motion. No thyromegaly present.  Cardiovascular: Normal rate, regular rhythm and normal heart sounds.   Pulmonary/Chest: Effort normal and breath sounds normal. No respiratory distress.  Abdominal: Soft. Bowel sounds are normal. She exhibits no distension and no mass. There is tenderness. There is no rebound and no guarding.  Genitourinary:  Guaiac negative stool.  Lymphadenopathy:    She has no cervical adenopathy.  Neurological: She is alert and oriented to person, place, and time.  Skin: Skin is warm and dry. Rash noted.       Rash on trunk resembles goose-flesh; no redness, discrete lesions, ulcerations noted. No discoloration.  Psychiatric: She has a normal mood and affect. Her behavior is normal. Thought content normal.       Pt slightly anxious.      POCT:   UA- S.G. 1.020, hazy,  Large blood (pt on menses); otherwise, negative               Urine Preg test : negative       Assessment & Plan:   1. Abdominal pain, acute, epigastric  Comprehensive metabolic panel, H. pylori antibody IgG, Lipase, omeprazole (PRILOSEC) 20 MG capsule 1 capsule twice a day and ondansetron (ZOFRAN) 4 MG tablet  1tablet as directed; Bland diet RTC 1 week for follow-up; seek ED care if symptoms worsen  2. Irregular menses  CBC with Differential pending  3. Hypothyroidism  TSH

## 2011-10-22 LAB — H. PYLORI ANTIBODY, IGG: H Pylori IgG: 0.56 {ISR}

## 2011-10-26 ENCOUNTER — Ambulatory Visit: Payer: Self-pay | Admitting: Family Medicine

## 2011-10-26 ENCOUNTER — Encounter: Payer: Self-pay | Admitting: Family Medicine

## 2011-10-26 VITALS — BP 105/58 | HR 84 | Temp 97.3°F | Resp 16 | Ht 66.0 in | Wt 135.2 lb

## 2011-10-26 DIAGNOSIS — N72 Inflammatory disease of cervix uteri: Secondary | ICD-10-CM

## 2011-10-26 DIAGNOSIS — E039 Hypothyroidism, unspecified: Secondary | ICD-10-CM

## 2011-10-26 DIAGNOSIS — N949 Unspecified condition associated with female genital organs and menstrual cycle: Secondary | ICD-10-CM

## 2011-10-26 LAB — POCT WET PREP WITH KOH

## 2011-10-26 MED ORDER — LEVOTHYROXINE SODIUM 125 MCG PO TABS: 125.0000 ug | ORAL_TABLET | Freq: Every day | ORAL | Status: DC

## 2011-10-26 MED ORDER — DOXYCYCLINE HYCLATE 100 MG PO TABS: 100.0000 mg | ORAL_TABLET | Freq: Two times a day (BID) | ORAL | Status: AC

## 2011-10-26 NOTE — Patient Instructions (Signed)
Cervicitis   Cervicitis is a soreness and swelling (inflammation) of the cervix. Your cervix is located at the bottom of your uterus which opens up to the vagina.   CAUSES   Sexually transmitted infections (STIs).   Allergic reaction.   Medicines or birth control devices that are put in the vagina.   Injury to the cervix.   Bacterial infections.   SYMPTOMS   There may be no symptoms. If symptoms occur, they may include:   Grey, white, yellow, or bad smelling vaginal discharge.   Pain or itching of the area outside the vagina.   Painful sexual intercourse.   Lower abdominal or lower back pain, especially during intercourse.   Frequent urination.   Abnormal vaginal bleeding between periods, after sexual intercourse, or after menopause.   Pressure or a heavy feeling in the pelvis.   DIAGNOSIS   Diagnosis is made after a pelvic exam. Other tests may include:   Examination of any discharge under a microscope (wet prep).   A Pap test.   TREATMENT   Treatment will depend on the cause of cervicitis. If it is caused by an STI, both you and your partner will need to be treated. Antibiotic medicines will be given.   HOME CARE INSTRUCTIONS   Do not have sexual intercourse until your caregiver says it is okay.   Do not have sexual intercourse until your partner has been treated if your cervicitis is caused by an STI.   Take your antibiotics as directed. Finish them even if you start to feel better.   SEEK IMMEDIATE MEDICAL CARE IF:   Your symptoms come back.   You have a fever.   You experience any problems that may be related to the medicine you are taking.   MAKE SURE YOU:   Understand these instructions.   Will watch your condition.   Will get help right away if you are not doing well or get worse.   Document Released: 07/30/2005 Document Revised: 07/19/2011 Document Reviewed: 02/26/2011   [employer] Patient Information 2012 [employer].

## 2011-10-26 NOTE — Progress Notes (Signed)
  Subjective:    Patient ID: Lynn White, female    DOB: 1966/05/12, 46 y.o.   MRN: [id number]  HPI This pt returns today for f/u to last week's visit when she had moderately severe abdominal pain  with n/v and irreg menstrual bleeding despite taking continuous non-cyclical OCPs. She no longer has  abdominal pain and is taking the PPI as directed. She has malodorous discharge which her boyfriend has  commented about and is associated with cramping pelvic discomfort.  Also, she continues to have a lot  of anxiety related to difficult separation and financial issues, etc.    Review of Systems As per HPI    Objective:   Physical Exam  Vitals reviewed. Constitutional: She is oriented to person, place, and time. She appears well-developed and well-nourished. She appears distressed.  HENT:  Head: Normocephalic and atraumatic.  Eyes: Conjunctivae and EOM are normal. No scleral icterus.  Neck: Normal range of motion. Neck supple. No thyromegaly present.  Abdominal: Soft. Bowel sounds are normal. She exhibits no distension and no mass. There is no tenderness. There is no guarding.  Genitourinary:       NEFG; mucopurulent discharge in vag vault and on cervix. Bimanual exam: mild tenderness with cervical motion but No pelvic masses felt. Uterus midline, no adnexal tenderness  Musculoskeletal: Normal range of motion.  Lymphadenopathy:    She has no cervical adenopathy.  Neurological: She is alert and oriented to person, place, and time.  Skin: Skin is warm and dry.  Psychiatric: She has a normal mood and affect. Her behavior is normal. Thought content normal.    Results for orders placed in visit on 10/26/11  POCT WET PREP WITH KOH      Component Value Range   Trichomonas, UA Negative     Clue Cells Wet Prep HPF POC 5-8     Epithelial Wet Prep HPF POC 8-12     Yeast Wet Prep HPF POC neg     Bacteria Wet Prep HPF POC 3+cocci     RBC Wet Prep HPF POC ne     WBC Wet Prep HPF POC 8-13       KOH Prep POC Negative           Assessment & Plan:   1. Cervicitis   GC/chlamydia probe amp, genital, doxycycline (VIBRA-TABS) 100 MG tablet  2. Hypothyroidism  Change dose of Levothyroxine from 88 mcg to 125 mcg And re-check TSH in 4-6 weeks.    RTC in 4 weeks.

## 2011-10-30 NOTE — Progress Notes (Signed)
Quick Note:  Please notify pt that results are normal.   Provide pt with copy of labs. ______ 

## 2011-11-02 ENCOUNTER — Encounter: Payer: Self-pay | Admitting: Family Medicine

## 2011-11-29 ENCOUNTER — Telehealth: Payer: Self-pay

## 2011-11-29 NOTE — Telephone Encounter (Signed)
PT WENT OFF BCP DUE TO MCPHERSONS ADVICE BACK IN MARCH. PT HAD A PERIOD MIDDLE OF LAST MONTH. PT IS NOW 2 DAYS LATE FOR PERIOD THIS MONTH--SHE WANTS TO KNOW IF THIS IS NORMAL.

## 2011-11-29 NOTE — Telephone Encounter (Signed)
This can be normal as you are transitioning hormone levels with OCPs. If persists RTC

## 2011-11-29 NOTE — Telephone Encounter (Signed)
Pt would like to speak to clinical person re her change in birth control methods.  Also she has a return visit with Dr Audria Nine and she is fine and would like to know if it is urgent that she come in.

## 2011-11-30 ENCOUNTER — Ambulatory Visit: Payer: Self-pay | Admitting: Family Medicine

## 2011-11-30 NOTE — Telephone Encounter (Signed)
Spoke with patient and let her know that her issue was common and that if she continue to have problems to return to clinic.  Patient stated that she understood.

## 2011-12-10 ENCOUNTER — Telehealth: Payer: Self-pay | Admitting: Family Medicine

## 2011-12-10 NOTE — Telephone Encounter (Signed)
Patient states that she is having bad hot flashes and night sweats.  Does she need a prescription for this or can she try something over the counter.

## 2011-12-14 NOTE — Telephone Encounter (Signed)
She can try OTC Estroven or New Phase; these can be found in the Women's Vitamins and supplement section. She can also go do [employer] or [employer] and ask for assistance with choosing a supplement.

## 2011-12-14 NOTE — Telephone Encounter (Signed)
LMOM w/info from Dr Audria Nine. Asked for CB w/any further ?s about these suggestions

## 2012-02-04 ENCOUNTER — Telehealth: Payer: Self-pay

## 2012-02-04 NOTE — Telephone Encounter (Signed)
PT WANTED DR MCPHERSON TO KNOW THAT SHE HAVE BEEN ABLE TO WIN AGAINST THE HOT FLASHES AND THINK SHE NEED A PRESCRIPTION CALLED IN PLEASE CALL [phone]    [facility] ON [address]

## 2012-02-05 NOTE — Telephone Encounter (Signed)
Pt reports that her hot flashes have not been helped by the [employer]. They maybe last 3 mins instead of 5, but have not lessened in number or intensity. Pt requests a RX for something else to try.

## 2012-02-05 NOTE — Telephone Encounter (Signed)
I reviewed chart and pt was supposed to follow-up 4 weeks after March visit. She needs to come in to discuss menopause symptoms as well as recheck thyroid tests.

## 2012-02-05 NOTE — Telephone Encounter (Signed)
Gave pt instr's from Dr Audria Nine. Pt hesitant to RTC d/t cost and no insurance, but did remember that Dr Audria Nine had wanted to recheck her thyroid. Transferred to 104 to set up appt.

## 2012-03-12 ENCOUNTER — Ambulatory Visit (INDEPENDENT_AMBULATORY_CARE_PROVIDER_SITE_OTHER): Payer: Self-pay | Admitting: Emergency Medicine

## 2012-03-12 ENCOUNTER — Emergency Department (HOSPITAL_COMMUNITY): Payer: Self-pay

## 2012-03-12 ENCOUNTER — Encounter (HOSPITAL_COMMUNITY): Payer: Self-pay | Admitting: Emergency Medicine

## 2012-03-12 ENCOUNTER — Emergency Department (HOSPITAL_COMMUNITY)
Admission: EM | Admit: 2012-03-12 | Discharge: 2012-03-12 | Disposition: A | Discharge status: Home / Self Care | Payer: Self-pay | Attending: Emergency Medicine | Admitting: Emergency Medicine

## 2012-03-12 VITALS — BP 120/80 | HR 82 | Temp 98.0°F | Resp 16 | Ht 65.25 in | Wt 136.8 lb

## 2012-03-12 DIAGNOSIS — R479 Unspecified speech disturbances: Secondary | ICD-10-CM

## 2012-03-12 DIAGNOSIS — R5381 Other malaise: Secondary | ICD-10-CM | POA: Insufficient documentation

## 2012-03-12 DIAGNOSIS — H5789 Other specified disorders of eye and adnexa: Secondary | ICD-10-CM | POA: Insufficient documentation

## 2012-03-12 DIAGNOSIS — R51 Headache: Secondary | ICD-10-CM | POA: Insufficient documentation

## 2012-03-12 DIAGNOSIS — R4789 Other speech disturbances: Secondary | ICD-10-CM

## 2012-03-12 DIAGNOSIS — L559 Sunburn, unspecified: Secondary | ICD-10-CM

## 2012-03-12 LAB — BASIC METABOLIC PANEL
BUN: 12 mg/dL (ref 6–23)
Calcium: 10 mg/dL (ref 8.4–10.5)
Creatinine, Ser: 0.77 mg/dL (ref 0.50–1.10)
GFR calc Af Amer: >90 mL/min (ref >90)
GFR calc non Af Amer: >90 mL/min (ref >90)

## 2012-03-12 LAB — CBC WITH DIFFERENTIAL/PLATELET
Basophils Absolute: 0 10*3/uL (ref 0.0–0.1)
Basophils Relative: 0 % (ref 0–1)
Eosinophils Absolute: 0.1 10*3/uL (ref 0.0–0.7)
Eosinophils Relative: 2 % (ref 0–5)
HCT: 32.7 % — ABNORMAL LOW (ref 36.0–46.0)
Hemoglobin: 11.1 g/dL — ABNORMAL LOW (ref 12.0–15.0)
MCH: 31.2 pg (ref 26.0–34.0)
MCHC: 33.9 g/dL (ref 30.0–36.0)
Monocytes Absolute: 0.4 10*3/uL (ref 0.1–1.0)
Monocytes Relative: 8 % (ref 3–12)
RDW: 13.5 % (ref 11.5–15.5)

## 2012-03-12 MED ORDER — SODIUM CHLORIDE 0.9 % IV BOLUS (SEPSIS)
1000.0000 mL | Freq: Once | INTRAVENOUS | Status: AC
  Administered 2012-03-12: 1000 mL via INTRAVENOUS

## 2012-03-12 MED ORDER — METOCLOPRAMIDE HCL 5 MG/ML IJ SOLN
10.0000 mg | Freq: Once | INTRAMUSCULAR | Status: AC
  Administered 2012-03-12: 10 mg via INTRAVENOUS
  Filled 2012-03-12: qty 2

## 2012-03-12 MED ORDER — DEXAMETHASONE SODIUM PHOSPHATE 10 MG/ML IJ SOLN
10.0000 mg | Freq: Once | INTRAMUSCULAR | Status: AC
  Administered 2012-03-12: 10 mg via INTRAVENOUS
  Filled 2012-03-12: qty 1

## 2012-03-12 MED ORDER — ONDANSETRON HCL 4 MG/2ML IJ SOLN
4.0000 mg | Freq: Once | INTRAMUSCULAR | Status: AC
  Administered 2012-03-12: 4 mg via INTRAVENOUS
  Filled 2012-03-12: qty 2

## 2012-03-12 MED ORDER — FENTANYL CITRATE 0.05 MG/ML IJ SOLN
50.0000 ug | Freq: Once | INTRAMUSCULAR | Status: AC
  Administered 2012-03-12: 50 ug via INTRAVENOUS
  Filled 2012-03-12: qty 2
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
## 2012-03-12 NOTE — ED Notes (Signed)
Called report to Lori RN

## 2012-03-12 NOTE — ED Notes (Signed)
Pt. Transferred to MRI 

## 2012-03-12 NOTE — ED Notes (Signed)
Pt. Returned from MRI. Pt. Is alert and oriented X3,  Speech is clear.  contitnues to have a headache and she has periorbital swelling, bilateral.
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
## 2012-03-12 NOTE — ED Provider Notes (Signed)
4:30 PM Assumed care of patient in the CDU from Marlon Pel, PA-C.  Patient presented today with a chief complaint of headache and slurred speech.  She is also complaining of generalized weakness.  No focal neurological deficits on exam.  According to Tiffany no slurred speech appreciated.  Head CT showing some abnormalities.  Therefore, MRI brain was ordered.  MRI pending.  Patient is currently getting her MRI.  Ct Head Wo Contrast  03/12/2012 *RADIOLOGY REPORT* Clinical Data: Generalized weakness. Left-sided facial heaviness. Difficulty. Slurred speech. CT HEAD WITHOUT CONTRAST Technique: Contiguous axial images were obtained from the base of the skull through the vertex without contrast. Comparison: None. Findings: Asymmetric subcortical white matter hypoattenuation is present in the left parietal lobe, best seen on image number 18 of series 2. No acute cortical infarct, hemorrhage, or mass lesion is present. The ventricles are of normal size. No significant extra-axial fluid collection is present. The paranasal sinuses and mastoid air cells are clear. IMPRESSION: Asymmetric subcortical white matter hypoattenuation involving the left parietal lobe. The differential diagnosis includes chronic white matter disease, acute infarction, or posterior reversible encephalopathy syndrome. MRI of the brain without contrast would be useful for further evaluation.   5:00 PM MRI results normal.  Discussed results with the patient.  She reports that she continues to have a headache.  She does not feel that the Fentanyl helped.  Will order Migraine Cocktail and reassess.  Reassessed patient.  Patient alert and orientated x 3, mild periorbital edema, no edema of lips or tongue,  Heart RRR, Lungs CTAB, CN II-XII grossly intact, grip strength 5/5 bilaterally, no slurred speech appreciated, normal gait, no ataxia.    5:58 PM Reassessed patient.  She reports that her headache has improved.  Will discharged patient  home.  Pascal Lux [place], PA-C 03/12/12 1926
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
## 2012-03-12 NOTE — ED Notes (Signed)
Care transferred and report given to [facility], [place].

## 2012-03-13 NOTE — ED Provider Notes (Signed)
Medical screening examination/treatment/procedure(s) were performed by non-physician practitioner and as supervising physician I was immediately available for consultation/collaboration.  Cheri Guppy, MD 03/13/12 [phone]

## 2012-03-14 ENCOUNTER — Other Ambulatory Visit: Payer: Self-pay | Admitting: Physician Assistant

## 2012-03-14 MED ORDER — LEVOTHYROXINE SODIUM 125 MCG PO TABS: 125.0000 ug | ORAL_TABLET | Freq: Every day | ORAL | Status: DC

## 2012-03-14 NOTE — ED Provider Notes (Signed)
Medical screening examination/treatment/procedure(s) were performed by non-physician practitioner and as supervising physician I was immediately available for consultation/collaboration.  Terrace Chiem R. Kally Cadden, MD 03/14/12 1047 

## 2012-03-18 ENCOUNTER — Telehealth: Payer: Self-pay

## 2012-03-18 DIAGNOSIS — R1013 Epigastric pain: Secondary | ICD-10-CM

## 2012-03-18 MED ORDER — OMEPRAZOLE 20 MG PO CPDR: 20.0000 mg | DELAYED_RELEASE_CAPSULE | Freq: Every day | ORAL | Status: DC

## 2012-03-18 MED ORDER — LEVOTHYROXINE SODIUM 125 MCG PO TABS: 125.0000 ug | ORAL_TABLET | Freq: Every day | ORAL | Status: DC

## 2012-03-18 MED ORDER — CITALOPRAM HYDROBROMIDE 40 MG PO TABS: 40.0000 mg | ORAL_TABLET | Freq: Every day | ORAL | Status: DC

## 2012-03-18 NOTE — Telephone Encounter (Signed)
Called pt she was advised
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
## 2012-03-18 NOTE — Telephone Encounter (Signed)
Done and sent in 

## 2012-03-18 NOTE — Telephone Encounter (Signed)
PATIENT STATES [facility] OVER REQUEST FOR HER MED REFFILLS,SHE WAS IN TO BE SEEN AS EMERGENCY AND CANNOT AFFORD TO COME BACK ANY TIME SOON.  BEST PHONE [phone]

## 2012-03-26 ENCOUNTER — Encounter: Payer: Self-pay | Admitting: Emergency Medicine

## 2012-04-02 ENCOUNTER — Other Ambulatory Visit: Payer: Self-pay | Admitting: *Deleted

## 2012-04-02 MED ORDER — HYDROXYZINE HCL 10 MG PO TABS: 10.0000 mg | ORAL_TABLET | Freq: Every evening | ORAL | Status: DC

## 2012-07-23 ENCOUNTER — Telehealth: Payer: Self-pay

## 2012-07-23 DIAGNOSIS — R1013 Epigastric pain: Secondary | ICD-10-CM

## 2012-07-23 MED ORDER — LEVOTHYROXINE SODIUM 125 MCG PO TABS: 125.0000 ug | ORAL_TABLET | Freq: Every day | ORAL | Status: DC

## 2012-07-23 MED ORDER — OMEPRAZOLE 20 MG PO CPDR: 20.0000 mg | DELAYED_RELEASE_CAPSULE | Freq: Every day | ORAL | Status: DC

## 2012-07-23 MED ORDER — NAPROXEN 500 MG PO TABS: 500.0000 mg | ORAL_TABLET | Freq: Two times a day (BID) | ORAL | Status: DC

## 2012-07-23 NOTE — Telephone Encounter (Signed)
PT STATES SHE CAN'T AFFORD TO COME IN BECAUSE SHE JUST STARTED A NEW JOB AND DOESN'T HAVE INSURANCE, REALLY WOULD LIKE TO HAVE HER PRILOSEC,NAPROSYN AND THYROID MEDICINE REFILLED UNTIL SHE IS ABLE TO COME IN IN February PLEASE CALL [phone]    [facility]

## 2012-07-23 NOTE — Telephone Encounter (Signed)
Sent.  Meds ordered this encounter  Medications  . levothyroxine (SYNTHROID, LEVOTHROID) 125 MCG tablet    Sig: Take 1 tablet (125 mcg total) by mouth daily.    Dispense:  30 tablet    Refill:  2    Order Specific Question:  Supervising Provider    Answer:  DOOLITTLE, ROBERT P [[id number]]  . naproxen (NAPROSYN) 500 MG tablet    Sig: Take 1 tablet (500 mg total) by mouth 2 (two) times daily with a meal.    Dispense:  60 tablet    Refill:  2    Order Specific Question:  Supervising Provider    Answer:  DOOLITTLE, ROBERT P [[id number]]  . omeprazole (PRILOSEC) 20 MG capsule    Sig: Take 1 capsule (20 mg total) by mouth daily.    Dispense:  30 capsule    Refill:  2    Order Specific Question:  Supervising Provider    Answer:  DOOLITTLE, ROBERT P [[id number]]

## 2012-07-24 MED ORDER — CITALOPRAM HYDROBROMIDE 40 MG PO TABS: 40.0000 mg | ORAL_TABLET | Freq: Every day | ORAL | Status: DC

## 2012-07-24 NOTE — Telephone Encounter (Signed)
Notified pt on VM that citalopram has also been sent in for her until she can get in to see [place] in Feb.

## 2012-07-24 NOTE — Telephone Encounter (Signed)
Left message to return call 

## 2012-07-24 NOTE — Telephone Encounter (Signed)
She also needs CItalopram, have pended this, she is very grateful and will follow up as soon as her insurance benefits begin.

## 2012-07-24 NOTE — Telephone Encounter (Signed)
Rx done and sent to pharmacy 

## 2013-08-25 ENCOUNTER — Encounter: Payer: Self-pay | Admitting: Family Medicine

## 2013-08-25 ENCOUNTER — Ambulatory Visit (INDEPENDENT_AMBULATORY_CARE_PROVIDER_SITE_OTHER): Payer: No Typology Code available for payment source | Admitting: Family Medicine

## 2013-08-25 VITALS — BP 110/84 | HR 73 | Temp 98.2°F | Resp 16 | Ht 65.0 in | Wt 129.2 lb

## 2013-08-25 DIAGNOSIS — N760 Acute vaginitis: Secondary | ICD-10-CM

## 2013-08-25 DIAGNOSIS — N951 Menopausal and female climacteric states: Secondary | ICD-10-CM

## 2013-08-25 DIAGNOSIS — Z1231 Encounter for screening mammogram for malignant neoplasm of breast: Secondary | ICD-10-CM

## 2013-08-25 DIAGNOSIS — Z124 Encounter for screening for malignant neoplasm of cervix: Secondary | ICD-10-CM

## 2013-08-25 DIAGNOSIS — Z01419 Encounter for gynecological examination (general) (routine) without abnormal findings: Secondary | ICD-10-CM

## 2013-08-25 DIAGNOSIS — Z862 Personal history of diseases of the blood and blood-forming organs and certain disorders involving the immune mechanism: Secondary | ICD-10-CM

## 2013-08-25 DIAGNOSIS — Z8639 Personal history of other endocrine, nutritional and metabolic disease: Secondary | ICD-10-CM

## 2013-08-25 DIAGNOSIS — Z0000 Encounter for general adult medical examination without abnormal findings: Secondary | ICD-10-CM

## 2013-08-25 DIAGNOSIS — B9689 Other specified bacterial agents as the cause of diseases classified elsewhere: Secondary | ICD-10-CM

## 2013-08-25 LAB — POCT WET PREP WITH KOH
KOH PREP POC: NEGATIVE
TRICHOMONAS UA: NEGATIVE
YEAST WET PREP PER HPF POC: NEGATIVE

## 2013-08-25 LAB — COMPLETE METABOLIC PANEL WITH GFR
ALK PHOS: 77 U/L (ref 39–117)
ALT: 10 U/L (ref 0–35)
AST: 13 U/L (ref 0–37)
Albumin: 4.6 g/dL (ref 3.5–5.2)
BILIRUBIN TOTAL: 0.6 mg/dL (ref 0.3–1.2)
BUN: 11 mg/dL (ref 6–23)
CO2: 28 mEq/L (ref 19–32)
CREATININE: 0.78 mg/dL (ref 0.50–1.10)
Calcium: 10.1 mg/dL (ref 8.4–10.5)
Chloride: 101 mEq/L (ref 96–112)
GFR, Est Non African American: >89 mL/min
Glucose, Bld: 89 mg/dL (ref 70–99)
Potassium: 4.7 mEq/L (ref 3.5–5.3)
SODIUM: 139 meq/L (ref 135–145)
TOTAL PROTEIN: 7.1 g/dL (ref 6.0–8.3)

## 2013-08-25 LAB — POCT URINALYSIS DIPSTICK
Bilirubin, UA: NEGATIVE
Glucose, UA: NEGATIVE
KETONES UA: NEGATIVE
Leukocytes, UA: NEGATIVE
Nitrite, UA: NEGATIVE
PH UA: 5.5
PROTEIN UA: NEGATIVE
UROBILINOGEN UA: 0.2

## 2013-08-25 LAB — TSH: TSH: 5.781 u[IU]/mL — ABNORMAL HIGH (ref 0.350–4.500)

## 2013-08-25 LAB — T3, FREE: T3 FREE: 3.2 pg/mL (ref 2.3–4.2)

## 2013-08-25 MED ORDER — METRONIDAZOLE 0.75 % VA GEL: 1.0000 | Freq: Two times a day (BID) | VAGINAL | Status: DC

## 2013-08-25 MED ORDER — NORGESTIMATE-ETH ESTRADIOL 0.25-35 MG-MCG PO TABS: 1.0000 | ORAL_TABLET | Freq: Every day | ORAL | Status: DC

## 2013-08-25 MED ORDER — NORETHIN-ETH ESTRAD TRIPHASIC 0.5/0.75/1-35 MG-MCG PO TABS: 1.0000 | ORAL_TABLET | Freq: Every day | ORAL | Status: DC

## 2013-08-25 NOTE — Progress Notes (Signed)
Subjective:    Patient ID: Lynn White, female    DOB: [birth year], 48 y.o.   MRN: [id number]  HPI  This 48 y.o. Cauc female is here for CPE/PAP. Last OV 2 years ago; pt has since stopped all medications due to lack or insurance. She now has insurance through [employer]. Her main concern is menopause symptoms (night sweats and hot flashes) w/ significant sleep disruption. Pt has been told she snores loudly and sometimes stops awakens herself. She cannot afford a sleep study, given the type of insurance she has. Pt has hx of hypothyroidism but is off medication; she notes no difference since stopping medication. She recently went through a divorce, is living with a female roommate and is involved in a monogamous heterosexual relationship. Her boyfriend is concerned about ankle edema.  HCM:    Review of Systems  Constitutional: Negative.   HENT: Negative.   Eyes: Negative.   Respiratory: Negative.   Cardiovascular: Negative.   Gastrointestinal: Negative.   Endocrine: Negative.   Genitourinary: Negative.   Musculoskeletal: Positive for arthralgias.  Skin: Negative.   Allergic/Immunologic: Negative.   Neurological: Negative.   Hematological: Negative.   Psychiatric/Behavioral: Positive for sleep disturbance, dysphoric mood and decreased concentration. The patient is nervous/anxious.        Objective:   Physical Exam  Nursing note and vitals reviewed. Constitutional: She is oriented to person, place, and time. Vital signs are normal. She appears well-developed and well-nourished. No distress.  HENT:  Head: Normocephalic and atraumatic.  Right Ear: Hearing, tympanic membrane, external ear and ear canal normal.  Left Ear: Hearing, tympanic membrane, external ear and ear canal normal.  Nose: Nose normal. No nasal deformity or septal deviation.  Mouth/Throat: Uvula is midline, oropharynx is clear and moist and mucous membranes are normal. No oral lesions. Normal dentition. No dental caries.    Eyes: Conjunctivae, EOM and lids are normal. Pupils are equal, round, and reactive to light. No scleral icterus.  Neck: Normal range of motion and full passive range of motion without pain. Neck supple. No spinous process tenderness and no muscular tenderness present. No mass and no thyromegaly present.  Cardiovascular: Normal rate, regular rhythm, S1 normal, normal heart sounds and normal pulses.   No extrasystoles are present. PMI is not displaced.  Exam reveals no gallop, no distant heart sounds and no friction rub.   No murmur heard. Pulmonary/Chest: Effort normal and breath sounds normal. No respiratory distress. She has no decreased breath sounds. She has no wheezes. She has no rales. Right breast exhibits no inverted nipple, no mass, no nipple discharge, no skin change and no tenderness. Left breast exhibits no inverted nipple, no mass, no nipple discharge, no skin change and no tenderness. Breasts are symmetrical.  Bilateral breast implants.  Abdominal: Soft. Normal appearance and bowel sounds are normal. She exhibits no distension and no mass. There is no hepatosplenomegaly. There is no tenderness. There is no guarding and no CVA tenderness. No hernia.  Genitourinary: Rectum normal and uterus normal. There is no rash, tenderness or lesion on the right labia. There is no rash, tenderness or lesion on the left labia. Uterus is not enlarged and not tender. Cervix exhibits discharge. Cervix exhibits no motion tenderness and no friability. Right adnexum displays no mass, no tenderness and no fullness. Left adnexum displays no mass, no tenderness and no fullness. No erythema, tenderness or bleeding around the vagina. No signs of injury around the vagina. Vaginal discharge found.  Nonparous vaginal orifice  and cervical os. Vaginismus during exam.  Lymphadenopathy:       Head (right side): No submental, no submandibular, no tonsillar, no posterior auricular and no occipital adenopathy present.        Head (left side): No submental, no submandibular, no tonsillar, no posterior auricular and no occipital adenopathy present.    She has no cervical adenopathy.    She has no axillary adenopathy.       Right: No inguinal and no supraclavicular adenopathy present.       Left: No inguinal and no supraclavicular adenopathy present.  Neurological: She is alert and oriented to person, place, and time. She has normal strength. She displays no atrophy. No cranial nerve deficit or sensory deficit. She exhibits normal muscle tone. Coordination and gait normal.  Reflex Scores:      Tricep reflexes are 2+ on the right side and 2+ on the left side.      Bicep reflexes are 3+ on the right side and 3+ on the left side.      Brachioradialis reflexes are 2+ on the right side and 2+ on the left side.      Patellar reflexes are 3+ on the right side and 3+ on the left side. Skin: Skin is warm, dry and intact. No ecchymosis, no lesion and no rash noted. She is not diaphoretic. No cyanosis or erythema. No pallor. Nails show no clubbing.  Psychiatric: She has a normal mood and affect. Her speech is normal and behavior is normal. Judgment and thought content normal. Cognition and memory are normal.    Results for orders placed in visit on 08/25/13  POCT URINALYSIS DIPSTICK      Result Value Range   Color, UA yellow     Clarity, UA clear     Glucose, UA neg     Bilirubin, UA neg     Ketones, UA neg     Spec Grav, UA >=1.030     Blood, UA moderate     pH, UA 5.5     Protein, UA neg     Urobilinogen, UA 0.2     Nitrite, UA neg     Leukocytes, UA Negative    POCT WET PREP WITH KOH      Result Value Range   Trichomonas, UA Negative     Clue Cells Wet Prep HPF POC tntc     Epithelial Wet Prep HPF POC tntc     Yeast Wet Prep HPF POC neg     Bacteria Wet Prep HPF POC 2+     RBC Wet Prep HPF POC 0-2     WBC Wet Prep HPF POC 0-4     KOH Prep POC Negative         Assessment & Plan:  Routine general medical  examination at a health care facility - Plan: POCT urinalysis dipstick, POCT Wet Prep with KOH, COMPLETE METABOLIC PANEL WITH GFR  Encounter for cervical Pap smear with pelvic exam - Plan: Pap IG, CT/NG w/ reflex HPV when ASC-U, POCT Wet Prep with KOH  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  Bacterial vaginosis  History of hypothyroidism - Plan: TSH, Free T3  Other screening mammogram - Plan: MM Digital Screening  Meds ordered this encounter  Medications                 . norgestimate-ethinyl estradiol (ORTHO-CYCLEN,SPRINTEC,PREVIFEM) 0.25-35 MG-MCG tablet    Sig: Take 1 tablet by mouth daily.    Dispense:  1 Package    Refill:  11  . metroNIDAZOLE (METROGEL VAGINAL) 0.75 % vaginal gel    Sig: Place 1 Applicatorful vaginally 2 (two) times daily.    Dispense:  70 g    Refill:  0

## 2013-08-25 NOTE — Patient Instructions (Addendum)
Keeping You Healthy  Get These Tests 1. Blood Pressure- Have your blood pressure checked once a year by your health care provider.  Normal blood pressure is 120/80. 2. Weight- Have your body mass index (BMI) calculated to screen for obesity.  BMI is measure of body fat based on height and weight.  You can also calculate your own BMI at GravelBags.it. 3. Cholesterol- Have your cholesterol checked every 5 years starting at age 49 then yearly starting at age 17. 89. Chlamydia, HIV, and other sexually transmitted diseases- Get screened every year until age 68, then within three months of each new sexual provider. 5. Pap Smear- Every 1-3 years; discuss with your health care provider. 6. Mammogram- Every year starting at age 65  Take these medicines  Calcium with Vitamin D-Your body needs 1200 mg of Calcium each day and [phone] IU of Vitamin D daily.  Your body can only absorb 500 mg of Calcium at a time so Calcium must be taken in 2 or 3 divided doses throughout the day.  Multivitamin with folic acid- Once daily if it is possible for you to become pregnant.  Get these Immunizations  Tetanus shot- Every 10 years. We do not have a record or Tdap immunization; this is a one-time vaccine recommended for all adults.  Flu shot-Every year.  Take these steps 1. Do not smoke-Your healthcare provider can help you quit.  For tips on how to quit go to www.smokefree.gov or call 1-800 QUITNOW. 2. Be physically active- Exercise 5 days a week for at least 30 minutes.  If you are not already physically active, start slow and gradually work up to 30 minutes of moderate physical activity.  Examples of moderate activity include walking briskly, dancing, swimming, bicycling, etc. 3. Breast Cancer- A self breast exam every month is important for early detection of breast cancer.  For more information and instruction on self breast exams, ask your healthcare provider or  https://www.patel.info/. 4. Eat a healthy diet- Eat a variety of healthy foods such as fruits, vegetables, whole grains, low fat milk, low fat cheeses, yogurt, lean meats, poultry and fish, beans, nuts, tofu, etc.  For more information go to www. Thenutritionsource.org 5. Drink alcohol in moderation- Limit alcohol intake to one drink or less per day. Never drink and drive. 6. Depression- Your emotional health is as important as your physical health.  If you're feeling down or losing interest in things you normally enjoy please talk to your healthcare provider about being screened for depression. 7. Dental visit- Brush and floss your teeth twice daily; visit your dentist twice a year. 8. Eye doctor- Get an eye exam at least every 2 years. 9. Helmet use- Always wear a helmet when riding a bicycle, motorcycle, rollerblading or skateboarding. 84. Safe sex- If you may be exposed to sexually transmitted infections, use a condom. 11. Seat belts- Seat belts can save your live; always wear one. 12. Smoke/Carbon Monoxide detectors- These detectors need to be installed on the appropriate level of your home. Replace batteries at least once a year. 13. Skin cancer- When out in the sun please cover up and use sunscreen 15 SPF or higher. 14. Violence- If anyone is threatening or hurting you, please tell your healthcare provider.    Bacterial Vaginosis Bacterial vaginosis is a vaginal infection that occurs when the normal balance of bacteria in the vagina is disrupted. It results from an overgrowth of certain bacteria. This is the most common vaginal infection in women of childbearing  age. Treatment is important to prevent complications, especially in pregnant women, as it can cause a premature delivery. CAUSES  Bacterial vaginosis is caused by an increase in harmful bacteria that are normally present in smaller amounts in the vagina. Several different kinds of bacteria can cause bacterial  vaginosis. However, the reason that the condition develops is not fully understood. RISK FACTORS Certain activities or behaviors can put you at an increased risk of developing bacterial vaginosis, including:  Having a new sex partner or multiple sex partners.  Douching.  Using an intrauterine device (IUD) for contraception. Women do not get bacterial vaginosis from toilet seats, bedding, swimming pools, or contact with objects around them. SIGNS AND SYMPTOMS  Some women with bacterial vaginosis have no signs or symptoms. Common symptoms include:  Grey vaginal discharge.  A fishlike odor with discharge, especially after sexual intercourse.  Itching or burning of the vagina and vulva.  Burning or pain with urination. DIAGNOSIS  Your health care provider will take a medical history and examine the vagina for signs of bacterial vaginosis. A sample of vaginal fluid may be taken. Your health care provider will look at this sample under a microscope to check for bacteria and abnormal cells. A vaginal pH test may also be done.  TREATMENT  Bacterial vaginosis may be treated with antibiotic medicines. These may be given in the form of a pill or a vaginal cream. A second round of antibiotics may be prescribed if the condition comes back after treatment.  HOME CARE INSTRUCTIONS   Only take over-the-counter or prescription medicines as directed by your health care provider.  If antibiotic medicine was prescribed, take it as directed. Make sure you finish it even if you start to feel better.  Do not have sex until treatment is completed.  Tell all sexual partners that you have a vaginal infection. They should see their health care provider and be treated if they have problems, such as a mild rash or itching.  Practice safe sex by using condoms and only having one sex partner. SEEK MEDICAL CARE IF:   Your symptoms are not improving after 3 days of treatment.  You have increased discharge or  pain.  You have a fever. MAKE SURE YOU:   Understand these instructions.  Will watch your condition.  Will get help right away if you are not doing well or get worse. FOR MORE INFORMATION  Centers for Disease Control and Prevention, Division of STD Prevention: AppraiserFraud.fi American Sexual Health Association (ASHA): www.ashastd.org  Document Released: 07/30/2005 Document Revised: 05/20/2013 Document Reviewed: 03/11/2013 [facility] Patient Information 2014 [place].

## 2013-08-26 LAB — PAP IG, CT-NG, RFX HPV ASCU
CHLAMYDIA PROBE AMP: NEGATIVE
GC Probe Amp: NEGATIVE

## 2013-08-28 NOTE — Progress Notes (Signed)
Quick Note:  Please advise pt regarding following labs... All labs are normal except one of the thyroid results (TSH). Free T3 value is normal so I will not restart thyroid medication at this time. Thyroid tests may be rechecked at next visit. We will discuss at that visit.  PAP is normal/ negative and STD (chlamydai and gonorrhea) results are negative.  Copy to pt. ______

## 2013-08-30 ENCOUNTER — Encounter: Payer: Self-pay | Admitting: Family Medicine

## 2013-09-17 ENCOUNTER — Encounter: Payer: Self-pay | Admitting: Physician Assistant

## 2013-09-17 ENCOUNTER — Ambulatory Visit (INDEPENDENT_AMBULATORY_CARE_PROVIDER_SITE_OTHER): Payer: No Typology Code available for payment source | Admitting: Physician Assistant

## 2013-09-17 ENCOUNTER — Ambulatory Visit: Payer: No Typology Code available for payment source

## 2013-09-17 VITALS — BP 122/62 | HR 81 | Temp 98.0°F | Resp 16 | Wt 133.0 lb

## 2013-09-17 DIAGNOSIS — M549 Dorsalgia, unspecified: Secondary | ICD-10-CM

## 2013-09-17 MED ORDER — PREDNISONE 20 MG PO TABS: ORAL_TABLET | ORAL | Status: AC

## 2013-09-17 MED ORDER — CYCLOBENZAPRINE HCL 10 MG PO TABS: 10.0000 mg | ORAL_TABLET | Freq: Three times a day (TID) | ORAL | Status: AC | PRN

## 2013-09-17 NOTE — Patient Instructions (Signed)
The muscle relaxer can cause drowsiness, so you should not take it and drive.  Many people reserve it for bedtime, or days that they are not needing to drive. The prednisone should be taken with food and in the morning.

## 2013-09-17 NOTE — Progress Notes (Signed)
   Subjective:    Patient ID: Lynn White, female    DOB: 05/14/1966, 48 y.o.   MRN: [id number]  PCP: [facility] Lennox, MD  Chief Complaint  Patient presents with  . Back Pain    x 3 days    Medications, allergies, past medical history, surgical history, family history, social history and problem list reviewed and updated.  HPI 1 week ago today, in the shower, something "shifted" in the back.  She describes RIGHT sided constant throbbing pain, "like a toothache." Increased with moving the right leg.  Has to move around to get comfortable.  Went to [employer] [facility] [employer] Dietitian) x 1 for adjustment ("he got some air out"), but she was too sore to go back for additional treatment recommended for the next day.  Tried Ultram and hydrocodone. Took 800 mg ibuprofen several days ago with good results, but had no benefit with the doses last night or this morning. Tried heat and cold pack application. Has been working this whole time.  Was starting to feel better the past few days, and doing more.  Last night "something happened.  I felt this same way when I had kidney stones".      Control of B/B. No saddle anesthesia.  No radicular pain.  No paresthesias. No urinary symptoms. No fever or chills.  No nausea.   Review of Systems As above.    Objective:   Physical Exam  Vitals reviewed. Constitutional: She is oriented to person, place, and time. She appears well-developed and well-nourished. She is active and cooperative. No distress (but obviously uncomfortable sitting in the exam room).  Eyes: Conjunctivae are normal. No scleral icterus.  Neck: Normal range of motion. Neck supple. No thyromegaly present.  Pulmonary/Chest: Effort normal.  Musculoskeletal:       Thoracic back: Normal.       Lumbar back: She exhibits decreased range of motion, tenderness, bony tenderness, pain and spasm (RIGHT lumbar paraspinous muscles). She exhibits no swelling, no edema, no deformity and no laceration.    Lymphadenopathy:    She has no cervical adenopathy.  Neurological: She is alert and oriented to person, place, and time. She has normal strength and normal reflexes. No sensory deficit. She exhibits normal muscle tone. Coordination and gait normal.  Skin: Skin is warm and dry.  Psychiatric: She has a normal mood and affect. Her behavior is normal.      LS Spine: [facility] reading (PRIMARY) by  Dr. Carlota Raspberry.  Loss of lordosis.  Mildly reduced disc space at L3-4 and mild anterior spurring of L4, no spondylolisthesis.      Assessment & Plan:  1. Back pain Strain with spasm - DG Lumbar Spine Complete; Future - predniSONE (DELTASONE) 20 MG tablet; Take 3 PO QAM x3days, 2 PO QAM x3days, 1 PO QAM x3days  Dispense: 18 tablet; Refill: 0 - cyclobenzaprine (FLEXERIL) 10 MG tablet; Take 1 tablet (10 mg total) by mouth 3 (three) times daily as needed for muscle spasms.  Dispense: 30 tablet; Refill: 0  Rest, heat application.  No NSAIDS while taking prednisone, but OK to supplement with acetaminophen. Return for re-evaluation if symptoms worsen or persist.  Fara Chute, PA-C Physician Assistant-Certified [facility]

## 2013-09-22 ENCOUNTER — Ambulatory Visit (HOSPITAL_COMMUNITY)
Admission: RE | Admit: 2013-09-22 | Discharge: 2013-09-22 | Disposition: A | Discharge status: Home / Self Care | Payer: No Typology Code available for payment source | Source: Ambulatory Visit | Attending: Family Medicine | Admitting: Family Medicine

## 2013-09-22 ENCOUNTER — Other Ambulatory Visit: Payer: Self-pay | Admitting: Family Medicine

## 2013-09-22 DIAGNOSIS — Z1231 Encounter for screening mammogram for malignant neoplasm of breast: Secondary | ICD-10-CM

## 2013-10-13 ENCOUNTER — Telehealth: Payer: Self-pay

## 2013-10-13 MED ORDER — NORGESTIMATE-ETH ESTRADIOL 0.25-35 MG-MCG PO TABS: 1.0000 | ORAL_TABLET | Freq: Every day | ORAL | Status: DC

## 2013-10-13 NOTE — Telephone Encounter (Signed)
I have added a note to the pharmacy indicating that pt needs new pack of OCPs every 21 days so that she will not have menses. This should correct the problem w/ refills.

## 2013-10-13 NOTE — Telephone Encounter (Signed)
PT STATES DR MCPHERSON HAD GIVEN HER THE BCP'S AND DIDN'T PUT AS MANY REFILLS AS NEEDED. STATES SHE WANT HER TO CALL IN ENOUGH SO SHE CAN TAKE EVERY DAY SO SHE WON'T HAVE A PERIOD. PLEASE CALL [phone]   [facility] ON [address]

## 2013-10-14 NOTE — Telephone Encounter (Signed)
Spoke with patient and let her know that refills were sent to pharmacy.

## 2013-11-16 ENCOUNTER — Telehealth: Payer: Self-pay | Admitting: Family Medicine

## 2013-11-16 NOTE — Telephone Encounter (Signed)
Encounter opened in error

## 2013-11-25 ENCOUNTER — Other Ambulatory Visit: Payer: Self-pay | Admitting: Family Medicine

## 2013-11-26 ENCOUNTER — Other Ambulatory Visit: Payer: Self-pay

## 2013-11-26 ENCOUNTER — Ambulatory Visit: Payer: No Typology Code available for payment source | Admitting: Family Medicine

## 2013-11-26 NOTE — Telephone Encounter (Signed)
Rec request for med refill  

## 2013-11-27 ENCOUNTER — Other Ambulatory Visit: Payer: Self-pay

## 2013-11-27 ENCOUNTER — Ambulatory Visit: Payer: Self-pay | Admitting: Family Medicine

## 2013-11-27 NOTE — Telephone Encounter (Signed)
Received request for refill. 

## 2013-12-08 ENCOUNTER — Telehealth: Payer: Self-pay

## 2013-12-08 NOTE — Telephone Encounter (Signed)
Patient called stated because of her insurance her prescriptions [facility]. [phone]

## 2013-12-09 MED ORDER — NORGESTIMATE-ETH ESTRADIOL 0.25-35 MG-MCG PO TABS: 1.0000 | ORAL_TABLET | Freq: Every day | ORAL | Status: DC

## 2013-12-09 NOTE — Telephone Encounter (Signed)
[facility]. Pt notified.

## 2013-12-15 ENCOUNTER — Ambulatory Visit: Payer: Self-pay | Admitting: Family Medicine

## 2014-03-02 ENCOUNTER — Other Ambulatory Visit: Payer: Self-pay

## 2014-03-02 MED ORDER — NORGESTIMATE-ETH ESTRADIOL 0.25-35 MG-MCG PO TABS: 1.0000 | ORAL_TABLET | Freq: Every day | ORAL | Status: DC

## 2014-10-08 ENCOUNTER — Telehealth: Payer: Self-pay

## 2014-10-08 NOTE — Telephone Encounter (Signed)
Lynn White,  Pt is taking Adderall 0.5 tabs BID. Her insurance is requiring an authorization but I think the reason is because she has #60 written and not #30. Please advise is Rx is correct.

## 2014-10-11 NOTE — Telephone Encounter (Signed)
Please check the details here. I do not see that this patient has been prescribed Adderall.

## 2014-10-31 ENCOUNTER — Other Ambulatory Visit: Payer: Self-pay | Admitting: Physician Assistant

## 2015-07-14 DIAGNOSIS — T8859XA Other complications of anesthesia, initial encounter: Secondary | ICD-10-CM

## 2015-07-14 HISTORY — DX: Other complications of anesthesia, initial encounter: T88.59XA

## 2016-03-28 ENCOUNTER — Other Ambulatory Visit: Payer: Self-pay | Admitting: Neurosurgery

## 2016-03-28 DIAGNOSIS — M5412 Radiculopathy, cervical region: Secondary | ICD-10-CM

## 2016-04-20 ENCOUNTER — Ambulatory Visit
Admission: RE | Admit: 2016-04-20 | Discharge: 2016-04-20 | Disposition: A | Discharge status: Home / Self Care | Payer: BLUE CROSS/BLUE SHIELD | Source: Ambulatory Visit | Attending: Neurosurgery | Admitting: Neurosurgery

## 2016-04-20 ENCOUNTER — Encounter: Payer: Self-pay | Admitting: Radiology

## 2016-04-20 DIAGNOSIS — M5412 Radiculopathy, cervical region: Secondary | ICD-10-CM

## 2016-04-20 MED ORDER — DIAZEPAM 5 MG PO TABS
10.0000 mg | ORAL_TABLET | Freq: Once | ORAL | Status: AC
  Administered 2016-04-20: 10 mg via ORAL

## 2016-04-20 MED ORDER — MEPERIDINE HCL 100 MG/ML IJ SOLN
50.0000 mg | Freq: Once | INTRAMUSCULAR | Status: AC
  Administered 2016-04-20: 50 mg via INTRAMUSCULAR

## 2016-04-20 MED ORDER — ONDANSETRON HCL 4 MG/2ML IJ SOLN
4.0000 mg | Freq: Once | INTRAMUSCULAR | Status: AC
  Administered 2016-04-20: 4 mg via INTRAMUSCULAR

## 2016-04-20 MED ORDER — IOPAMIDOL (ISOVUE-M 300) INJECTION 61%
10.0000 mL | Freq: Once | INTRAMUSCULAR | Status: AC | PRN
  Administered 2016-04-20: 10 mL via INTRATHECAL

## 2016-04-20 NOTE — Progress Notes (Signed)
Pt states she has been off Citalopram and Buspar for the past 2 days.

## 2016-04-20 NOTE — Discharge Instructions (Signed)
Myelogram Discharge Instructions  1. Go home and rest quietly for the next 24 hours.  It is important to lie flat for the next 24 hours.  Get up only to go to the restroom.  You may lie in the bed or on a couch on your back, your stomach, your left side or your right side.  You may have one pillow under your head.  You may have pillows between your knees while you are on your side or under your knees while you are on your back.  2. DO NOT drive today.  Recline the seat as far back as it will go, while still wearing your seat belt, on the way home.  3. You may get up to go to the bathroom as needed.  You may sit up for 10 minutes to eat.  You may resume your normal diet and medications unless otherwise indicated.  Drink lots of extra fluids today and tomorrow.  4. The incidence of headache, nausea, or vomiting is about 5% (one in 20 patients).  If you develop a headache, lie flat and drink plenty of fluids until the headache goes away.  Caffeinated beverages may be helpful.  If you develop severe nausea and vomiting or a headache that does not go away with flat bed rest, call [phone].  5. You may resume normal activities after your 24 hours of bed rest is over; however, do not exert yourself strongly or do any heavy lifting tomorrow. If when you get up you have a headache when standing, go back to bed and force fluids for another 24 hours.  6. Call your physician for a follow-up appointment.  The results of your myelogram will be sent directly to your physician by the following day.  7. If you have any questions or if complications develop after you arrive home, please call [phone].  Discharge instructions have been explained to the patient.  The patient, or the person responsible for the patient, fully understands these instructions.       MAY RESUME CITALOPRAM AND BUSPAR ON SEPT. 9, 2017, AFTER 9:30 AM.

## 2016-04-23 ENCOUNTER — Telehealth: Payer: Self-pay

## 2016-04-23 NOTE — Telephone Encounter (Signed)
Patient states she is still in pain after her cervical myelogram here 04/20/16.  She can't quite state if the headache is positional, as it has been ongoing since the procedure.  She states she can't miss any more work and can't have any more procedures until the first of the year since she has no more time off.  jkl

## 2016-07-17 ENCOUNTER — Other Ambulatory Visit: Payer: Self-pay | Admitting: Neurosurgery

## 2016-07-26 ENCOUNTER — Encounter (HOSPITAL_COMMUNITY)
Admission: RE | Admit: 2016-07-26 | Discharge: 2016-07-26 | Disposition: A | Discharge status: Home / Self Care | Payer: BLUE CROSS/BLUE SHIELD | Source: Ambulatory Visit | Attending: Neurosurgery | Admitting: Neurosurgery

## 2016-07-26 ENCOUNTER — Encounter (HOSPITAL_COMMUNITY): Payer: Self-pay

## 2016-07-26 DIAGNOSIS — Z01812 Encounter for preprocedural laboratory examination: Secondary | ICD-10-CM | POA: Diagnosis present

## 2016-07-26 DIAGNOSIS — K219 Gastro-esophageal reflux disease without esophagitis: Secondary | ICD-10-CM | POA: Insufficient documentation

## 2016-07-26 DIAGNOSIS — Z85828 Personal history of other malignant neoplasm of skin: Secondary | ICD-10-CM | POA: Diagnosis not present

## 2016-07-26 DIAGNOSIS — E039 Hypothyroidism, unspecified: Secondary | ICD-10-CM | POA: Diagnosis not present

## 2016-07-26 DIAGNOSIS — F419 Anxiety disorder, unspecified: Secondary | ICD-10-CM | POA: Insufficient documentation

## 2016-07-26 DIAGNOSIS — Z9889 Other specified postprocedural states: Secondary | ICD-10-CM | POA: Diagnosis not present

## 2016-07-26 DIAGNOSIS — Z90710 Acquired absence of both cervix and uterus: Secondary | ICD-10-CM | POA: Insufficient documentation

## 2016-07-26 DIAGNOSIS — N200 Calculus of kidney: Secondary | ICD-10-CM | POA: Diagnosis not present

## 2016-07-26 HISTORY — DX: Nausea with vomiting, unspecified: R11.2

## 2016-07-26 HISTORY — DX: Gastro-esophageal reflux disease without esophagitis: K21.9

## 2016-07-26 HISTORY — DX: Malignant (primary) neoplasm, unspecified: C80.1

## 2016-07-26 HISTORY — DX: Hypothyroidism, unspecified: E03.9

## 2016-07-26 HISTORY — DX: Personal history of urinary calculi: Z87.442

## 2016-07-26 HISTORY — DX: Headache: R51

## 2016-07-26 HISTORY — DX: Sleep apnea, unspecified: G47.30

## 2016-07-26 HISTORY — DX: Cardiac arrhythmia, unspecified: I49.9

## 2016-07-26 HISTORY — DX: Headache, unspecified: R51.9

## 2016-07-26 HISTORY — DX: Unspecified osteoarthritis, unspecified site: M19.90

## 2016-07-26 HISTORY — DX: Other specified postprocedural states: Z98.890

## 2016-07-26 HISTORY — DX: Adverse effect of unspecified anesthetic, initial encounter: T41.45XA

## 2016-07-26 LAB — TYPE AND SCREEN
ABO/RH(D): O POS
Antibody Screen: NEGATIVE

## 2016-07-26 LAB — CBC
HCT: 35.3 % — ABNORMAL LOW (ref 36.0–46.0)
HEMOGLOBIN: 11.6 g/dL — AB (ref 12.0–15.0)
MCH: 30.2 pg (ref 26.0–34.0)
MCHC: 32.9 g/dL (ref 30.0–36.0)
MCV: 91.9 fL (ref 78.0–100.0)
Platelets: 267 10*3/uL (ref 150–400)
RBC: 3.84 MIL/uL — ABNORMAL LOW (ref 3.87–5.11)
RDW: 13.6 % (ref 11.5–15.5)
WBC: 7.5 10*3/uL (ref 4.0–10.5)

## 2016-07-26 LAB — BASIC METABOLIC PANEL
ANION GAP: 7 (ref 5–15)
BUN: 16 mg/dL (ref 6–20)
CALCIUM: 9.4 mg/dL (ref 8.9–10.3)
CO2: 27 mmol/L (ref 22–32)
Chloride: 105 mmol/L (ref 101–111)
Creatinine, Ser: 0.87 mg/dL (ref 0.44–1.00)
GFR calc Af Amer: >60 mL/min (ref >60)
GLUCOSE: 87 mg/dL (ref 65–99)
POTASSIUM: 4.5 mmol/L (ref 3.5–5.1)
SODIUM: 139 mmol/L (ref 135–145)

## 2016-07-26 LAB — SURGICAL PCR SCREEN
MRSA, PCR: NEGATIVE
STAPHYLOCOCCUS AUREUS: NEGATIVE

## 2016-07-26 LAB — ABO/RH: ABO/RH(D): O POS

## 2016-07-26 NOTE — Progress Notes (Signed)
Pt. Reports that she is followed by E. Todd at [facility] because of pt. Complaint of feeling palpitations she was but on a holter monitor at the [facility] in [place] told that she has some extra beats but that the recording was wnl.  Pt. Denies any advanced testing on her heart.  She reports having an event post op ( after hysterectomy- sounds like from pt.'s recall that it took place in PACU) with nausea that led to a an IV dose of Phenergan that made her "turn gray", she stated they "lost me", states she became "unresponsive." She later woke up with CPAP on but doesn't know whether she had any O2 continuously with CPAP.  Pt. Denies re-intubation post op. Chart will be referred to anesth. & a request via fax was made to [facility] for the anesth, Pacu & D/C record.

## 2016-07-26 NOTE — Pre-Procedure Instructions (Signed)
Lynn White  07/26/2016      [facility], [address] [place] [address] Phone: [phone] Fax: [phone]    Your procedure is scheduled on 07/30/2016  Report to [facility] Admitting at 7:45 A.M.  Call this number if you have problems the morning of surgery:  [phone]   Remember:  Do not eat food or drink liquids after midnight.  On Sunday    Take these medicines the morning of surgery with A SIP OF WATER: Buspar, Estradiol, Hydrocodone, Thyroid medicine, Zantac    Do not wear jewelry, make-up or nail polish.   Do not wear lotions, powders, or perfumes, or deoderant.   Do not shave 48 hours prior to surgery.    Do not bring valuables to the hospital.   [facility] is not responsible for any belongings or valuables.  Contacts, dentures or bridgework may not be worn into surgery.  Leave your suitcase in the car.  After surgery it may be brought to your room.  For patients admitted to the hospital, discharge time will be determined by your treatment team.  Patients discharged the day of surgery will not be allowed to drive home.   Name and phone number of your driver:   With Spouse  Special instructions:  Special Instructions: [facility] - Preparing for Surgery  Before surgery, you can play an important role.  Because skin is not sterile, your skin needs to be as free of germs as possible.  You can reduce the number of germs on you skin by washing with CHG (chlorahexidine gluconate) soap before surgery.  CHG is an antiseptic cleaner which kills germs and bonds with the skin to continue killing germs even after washing.  Please DO NOT use if you have an allergy to CHG or antibacterial soaps.  If your skin becomes reddened/irritated stop using the CHG and inform your nurse when you arrive at Short Stay.  Do not shave (including legs and underarms) for at least 48 hours prior to  the first CHG shower.  You may shave your face.  Please follow these instructions carefully:   1.  Shower with CHG Soap the night before surgery and the  morning of Surgery.  2.  If you choose to wash your hair, wash your hair first as usual with your  normal shampoo.  3.  After you shampoo, rinse your hair and body thoroughly to remove the  Shampoo.  4.  Use CHG as you would any other liquid soap.  You can apply chg directly to the skin and wash gently with scrungie or a clean washcloth.  5.  Apply the CHG Soap to your body ONLY FROM THE NECK DOWN.    Do not use on open wounds or open sores.  Avoid contact with your eyes, ears, mouth and genitals (private parts).  Wash genitals (private parts)   with your normal soap.  6.  Wash thoroughly, paying special attention to the area where your surgery will be performed.  7.  Thoroughly rinse your body with warm water from the neck down.  8.  DO NOT shower/wash with your normal soap after using and rinsing off   the CHG Soap.  9.  Pat yourself dry with a clean towel.            10 .  Wear clean pajamas.  11.  Place clean sheets on your bed the night of your first shower and do not sleep with pets.  Day of Surgery  Do not apply any lotions/deodorants the morning of surgery.  Please wear clean clothes to the hospital/surgery center.  Please read over the following fact sheets that you were given. Pain Booklet, Coughing and Deep Breathing, MRSA Information and Surgical Site Infection Prevention

## 2016-07-27 NOTE — Progress Notes (Signed)
Anesthesia Chart Review: Patient is a 50 year old female scheduled for C5-6, C6-7 ACDF on 07/30/16 by Dr. Arnoldo Morale.  History includes never smoker, post-operative N/V, suspected OSA (no formal testing), anxiety, hypothyroidism, dysrhythmia (occasional "extra beats" with reportedly normal Holter monitor), GERD, nephrolithiasis, skin cancer (BCC), hysterectomy/BSO 08/10/15, tonsillectomy, breast augmentation. For anesthesia history she reported what sounds like post-operative respiratory distress after receiving Phenergan ("gray" and "non-responsive", treated with CPAP post-op). Anesthesia records and discharge summary received which did not list any complications or document any desaturation events.   Meds include BuSpar, Benadryl, Lexapro, Estrace, Neurontin, Norco, levothyroxine, Zantac.  BP (!) 150/71   Pulse 65   Temp 36.6 C   Resp 20   Ht 5' 5.5" (1.664 m)   Wt 135 lb 3.2 oz (61.3 kg)   SpO2 99%   BMI 22.16 kg/m   Preoperative labs noted.   Her last EKG tracing from [facility] was requested. She reports a normal Holter monitor in the past for evaluation of "extra beats." 08/10/15 anesthesia records documents ECG as regular rhythm and rate. If tracing not received then I'll defer to her anesthesiologist if he/she would like to get a baseline EKG prior to surgery.   [facility] Short Stay Center/Anesthesiology Phone [phone] 07/27/2016 12:33 PM

## 2016-07-30 ENCOUNTER — Ambulatory Visit (HOSPITAL_COMMUNITY): Payer: BLUE CROSS/BLUE SHIELD | Admitting: Vascular Surgery

## 2016-07-30 ENCOUNTER — Ambulatory Visit (HOSPITAL_COMMUNITY): Payer: BLUE CROSS/BLUE SHIELD

## 2016-07-30 ENCOUNTER — Encounter (HOSPITAL_COMMUNITY)
Admission: RE | Disposition: A | Discharge status: Home / Self Care | Payer: Self-pay | Source: Ambulatory Visit | Attending: Neurosurgery

## 2016-07-30 ENCOUNTER — Encounter (HOSPITAL_COMMUNITY): Payer: Self-pay | Admitting: *Deleted

## 2016-07-30 ENCOUNTER — Ambulatory Visit (HOSPITAL_COMMUNITY)
Admission: RE | Admit: 2016-07-30 | Discharge: 2016-07-31 | Disposition: A | Discharge status: Home / Self Care | Payer: BLUE CROSS/BLUE SHIELD | Source: Ambulatory Visit | Attending: Neurosurgery | Admitting: Neurosurgery

## 2016-07-30 DIAGNOSIS — M19022 Primary osteoarthritis, left elbow: Secondary | ICD-10-CM | POA: Diagnosis not present

## 2016-07-30 DIAGNOSIS — M170 Bilateral primary osteoarthritis of knee: Secondary | ICD-10-CM | POA: Diagnosis not present

## 2016-07-30 DIAGNOSIS — E039 Hypothyroidism, unspecified: Secondary | ICD-10-CM | POA: Insufficient documentation

## 2016-07-30 DIAGNOSIS — Z881 Allergy status to other antibiotic agents status: Secondary | ICD-10-CM | POA: Diagnosis not present

## 2016-07-30 DIAGNOSIS — K219 Gastro-esophageal reflux disease without esophagitis: Secondary | ICD-10-CM | POA: Diagnosis not present

## 2016-07-30 DIAGNOSIS — M19021 Primary osteoarthritis, right elbow: Secondary | ICD-10-CM | POA: Diagnosis not present

## 2016-07-30 DIAGNOSIS — Z888 Allergy status to other drugs, medicaments and biological substances status: Secondary | ICD-10-CM | POA: Diagnosis not present

## 2016-07-30 DIAGNOSIS — Z85828 Personal history of other malignant neoplasm of skin: Secondary | ICD-10-CM | POA: Diagnosis not present

## 2016-07-30 DIAGNOSIS — M50122 Cervical disc disorder at C5-C6 level with radiculopathy: Secondary | ICD-10-CM | POA: Diagnosis present

## 2016-07-30 DIAGNOSIS — M4802 Spinal stenosis, cervical region: Secondary | ICD-10-CM | POA: Diagnosis not present

## 2016-07-30 DIAGNOSIS — Z87442 Personal history of urinary calculi: Secondary | ICD-10-CM | POA: Diagnosis not present

## 2016-07-30 DIAGNOSIS — Z885 Allergy status to narcotic agent status: Secondary | ICD-10-CM | POA: Insufficient documentation

## 2016-07-30 DIAGNOSIS — F410 Panic disorder [episodic paroxysmal anxiety] without agoraphobia: Secondary | ICD-10-CM | POA: Insufficient documentation

## 2016-07-30 DIAGNOSIS — M4722 Other spondylosis with radiculopathy, cervical region: Secondary | ICD-10-CM | POA: Diagnosis not present

## 2016-07-30 DIAGNOSIS — Z419 Encounter for procedure for purposes other than remedying health state, unspecified: Secondary | ICD-10-CM

## 2016-07-30 DIAGNOSIS — G4730 Sleep apnea, unspecified: Secondary | ICD-10-CM | POA: Diagnosis not present

## 2016-07-30 HISTORY — PX: ANTERIOR CERVICAL DECOMP/DISCECTOMY FUSION: SHX1161

## 2016-07-30 SURGERY — ANTERIOR CERVICAL DECOMPRESSION/DISCECTOMY FUSION 2 LEVELS
Anesthesia: General

## 2016-07-30 MED ORDER — FENTANYL CITRATE (PF) 100 MCG/2ML IJ SOLN
25.0000 ug | INTRAMUSCULAR | Status: DC | PRN
  Administered 2016-07-30 (×3): 50 ug via INTRAVENOUS

## 2016-07-30 MED ORDER — ESTRADIOL 1 MG PO TABS
1.0000 mg | ORAL_TABLET | Freq: Every day | ORAL | Status: DC
  Filled 2016-07-30: qty 1

## 2016-07-30 MED ORDER — BACITRACIN ZINC 500 UNIT/GM EX OINT
TOPICAL_OINTMENT | CUTANEOUS | Status: AC
Start: 2016-07-30 — End: 2016-07-30
  Filled 2016-07-30: qty 28.35

## 2016-07-30 MED ORDER — HYDROCODONE-ACETAMINOPHEN 5-325 MG PO TABS
1.0000 | ORAL_TABLET | Freq: Once | ORAL | Status: AC
  Administered 2016-07-30: 1 via ORAL

## 2016-07-30 MED ORDER — BUPIVACAINE HCL (PF) 0.5 % IJ SOLN
INTRAMUSCULAR | Status: AC
  Filled 2016-07-30: qty 30

## 2016-07-30 MED ORDER — DEXAMETHASONE SODIUM PHOSPHATE 4 MG/ML IJ SOLN
4.0000 mg | Freq: Four times a day (QID) | INTRAMUSCULAR | Status: AC
  Administered 2016-07-30 – 2016-07-31 (×2): 4 mg via INTRAVENOUS
  Filled 2016-07-30 (×2): qty 1

## 2016-07-30 MED ORDER — FENTANYL CITRATE (PF) 100 MCG/2ML IJ SOLN
INTRAMUSCULAR | Status: AC
  Filled 2016-07-30: qty 2

## 2016-07-30 MED ORDER — CEFAZOLIN SODIUM-DEXTROSE 2-4 GM/100ML-% IV SOLN
2.0000 g | INTRAVENOUS | Status: AC
  Administered 2016-07-30: 2 g via INTRAVENOUS

## 2016-07-30 MED ORDER — DIPHENHYDRAMINE HCL 25 MG PO TABS: 100.0000 mg | ORAL_TABLET | Freq: Every day | ORAL | Status: DC

## 2016-07-30 MED ORDER — BUSPIRONE HCL 10 MG PO TABS
10.0000 mg | ORAL_TABLET | Freq: Two times a day (BID) | ORAL | Status: DC
  Administered 2016-07-30: 10 mg via ORAL
  Filled 2016-07-30 (×2): qty 1

## 2016-07-30 MED ORDER — THROMBIN 5000 UNITS EX SOLR
CUTANEOUS | Status: AC
Start: 2016-07-30 — End: 2016-07-30
  Filled 2016-07-30: qty 5000

## 2016-07-30 MED ORDER — EPHEDRINE SULFATE 50 MG/ML IJ SOLN
INTRAMUSCULAR | Status: DC | PRN
  Administered 2016-07-30: 5 mg via INTRAVENOUS
  Administered 2016-07-30: 10 mg via INTRAVENOUS
  Administered 2016-07-30: 5 mg via INTRAVENOUS

## 2016-07-30 MED ORDER — ALUM & MAG HYDROXIDE-SIMETH 200-200-20 MG/5ML PO SUSP: 30.0000 mL | Freq: Four times a day (QID) | ORAL | Status: DC | PRN

## 2016-07-30 MED ORDER — ONDANSETRON HCL 4 MG/2ML IJ SOLN
INTRAMUSCULAR | Status: DC | PRN
  Administered 2016-07-30 (×2): 4 mg via INTRAVENOUS

## 2016-07-30 MED ORDER — SODIUM CHLORIDE 0.9 % IR SOLN
Status: DC | PRN
  Administered 2016-07-30: 11:00:00

## 2016-07-30 MED ORDER — THROMBIN 20000 UNITS EX SOLR
CUTANEOUS | Status: DC | PRN
  Administered 2016-07-30: 11:00:00 via TOPICAL

## 2016-07-30 MED ORDER — PHENYLEPHRINE HCL 10 MG/ML IJ SOLN
INTRAMUSCULAR | Status: DC | PRN
  Administered 2016-07-30 (×2): 80 ug via INTRAVENOUS

## 2016-07-30 MED ORDER — ONDANSETRON HCL 4 MG/2ML IJ SOLN
INTRAMUSCULAR | Status: AC
  Filled 2016-07-30: qty 2

## 2016-07-30 MED ORDER — GLYCOPYRROLATE 0.2 MG/ML IJ SOLN
INTRAMUSCULAR | Status: DC | PRN
  Administered 2016-07-30: 0.4 mg via INTRAVENOUS

## 2016-07-30 MED ORDER — DEXAMETHASONE SODIUM PHOSPHATE 10 MG/ML IJ SOLN
INTRAMUSCULAR | Status: DC | PRN
  Administered 2016-07-30: 10 mg via INTRAVENOUS

## 2016-07-30 MED ORDER — THROMBIN 5000 UNITS EX SOLR
CUTANEOUS | Status: DC | PRN
  Administered 2016-07-30 (×2): via TOPICAL

## 2016-07-30 MED ORDER — ONDANSETRON HCL 4 MG/2ML IJ SOLN
4.0000 mg | INTRAMUSCULAR | Status: DC | PRN
Start: 2016-07-30 — End: 2016-07-31
  Administered 2016-07-30 – 2016-07-31 (×3): 4 mg via INTRAVENOUS
  Filled 2016-07-30 (×3): qty 2

## 2016-07-30 MED ORDER — HYDROCODONE-ACETAMINOPHEN 7.5-325 MG PO TABS: 1.0000 | ORAL_TABLET | Freq: Once | ORAL | Status: DC | PRN

## 2016-07-30 MED ORDER — FENTANYL CITRATE (PF) 100 MCG/2ML IJ SOLN
INTRAMUSCULAR | Status: AC
  Filled 2016-07-30: qty 4

## 2016-07-30 MED ORDER — ACETAMINOPHEN 325 MG PO TABS
650.0000 mg | ORAL_TABLET | ORAL | Status: DC | PRN
  Administered 2016-07-30: 650 mg via ORAL
  Filled 2016-07-30: qty 2

## 2016-07-30 MED ORDER — THROMBIN 20000 UNITS EX SOLR
CUTANEOUS | Status: AC
Start: 2016-07-30 — End: 2016-07-30
  Filled 2016-07-30: qty 20000

## 2016-07-30 MED ORDER — DIAZEPAM 5 MG PO TABS
5.0000 mg | ORAL_TABLET | Freq: Four times a day (QID) | ORAL | Status: DC | PRN
  Administered 2016-07-31: 5 mg via ORAL
  Filled 2016-07-30: qty 1

## 2016-07-30 MED ORDER — GABAPENTIN 600 MG PO TABS
1800.0000 mg | ORAL_TABLET | Freq: Every day | ORAL | Status: DC
  Administered 2016-07-30: 1800 mg via ORAL
  Filled 2016-07-30: qty 3

## 2016-07-30 MED ORDER — DIPHENHYDRAMINE HCL 25 MG PO TABS
25.0000 mg | ORAL_TABLET | Freq: Every day | ORAL | Status: DC
  Administered 2016-07-30: 50 mg via ORAL
  Filled 2016-07-30 (×2): qty 2

## 2016-07-30 MED ORDER — 0.9 % SODIUM CHLORIDE (POUR BTL) OPTIME
TOPICAL | Status: DC | PRN
  Administered 2016-07-30: 1000 mL

## 2016-07-30 MED ORDER — GABAPENTIN 600 MG PO TABS: 1200.0000 mg | ORAL_TABLET | Freq: Every day | ORAL | Status: DC

## 2016-07-30 MED ORDER — LIDOCAINE HCL (CARDIAC) 20 MG/ML IV SOLN
INTRAVENOUS | Status: DC | PRN
  Administered 2016-07-30: 40 mg via INTRAVENOUS

## 2016-07-30 MED ORDER — PROPOFOL 10 MG/ML IV BOLUS
INTRAVENOUS | Status: AC
  Filled 2016-07-30: qty 20

## 2016-07-30 MED ORDER — ACETAMINOPHEN 650 MG RE SUPP
650.0000 mg | RECTAL | Status: DC | PRN
Start: 2016-07-30 — End: 2016-07-31

## 2016-07-30 MED ORDER — LACTATED RINGERS IV SOLN: INTRAVENOUS | Status: DC

## 2016-07-30 MED ORDER — LEVOTHYROXINE SODIUM 100 MCG PO TABS
50.0000 ug | ORAL_TABLET | Freq: Every day | ORAL | Status: DC
  Administered 2016-07-31: 50 ug via ORAL
  Filled 2016-07-30: qty 1

## 2016-07-30 MED ORDER — ROCURONIUM BROMIDE 100 MG/10ML IV SOLN
INTRAVENOUS | Status: DC | PRN
Start: 2016-07-30 — End: 2016-07-30
  Administered 2016-07-30: 50 mg via INTRAVENOUS

## 2016-07-30 MED ORDER — NEOSTIGMINE METHYLSULFATE 10 MG/10ML IV SOLN
INTRAVENOUS | Status: DC | PRN
  Administered 2016-07-30: 3 mg via INTRAVENOUS

## 2016-07-30 MED ORDER — MIDAZOLAM HCL 2 MG/2ML IJ SOLN
INTRAMUSCULAR | Status: AC
  Filled 2016-07-30: qty 2

## 2016-07-30 MED ORDER — LACTATED RINGERS IV SOLN
INTRAVENOUS | Status: DC
  Administered 2016-07-30 (×3): via INTRAVENOUS

## 2016-07-30 MED ORDER — DEXAMETHASONE 4 MG PO TABS
4.0000 mg | ORAL_TABLET | Freq: Four times a day (QID) | ORAL | Status: AC
  Administered 2016-07-31: 4 mg via ORAL
  Filled 2016-07-30: qty 1

## 2016-07-30 MED ORDER — TIZANIDINE HCL 4 MG PO TABS
8.0000 mg | ORAL_TABLET | Freq: Every evening | ORAL | Status: DC
  Administered 2016-07-30: 8 mg via ORAL
  Filled 2016-07-30: qty 2

## 2016-07-30 MED ORDER — FAMOTIDINE 20 MG PO TABS: 20.0000 mg | ORAL_TABLET | Freq: Two times a day (BID) | ORAL | Status: DC

## 2016-07-30 MED ORDER — PROPOFOL 10 MG/ML IV BOLUS
INTRAVENOUS | Status: DC | PRN
  Administered 2016-07-30: 40 mg via INTRAVENOUS
  Administered 2016-07-30: 100 mg via INTRAVENOUS

## 2016-07-30 MED ORDER — ARTIFICIAL TEARS OP OINT
TOPICAL_OINTMENT | OPHTHALMIC | Status: AC
  Filled 2016-07-30: qty 3.5

## 2016-07-30 MED ORDER — FENTANYL CITRATE (PF) 100 MCG/2ML IJ SOLN
INTRAMUSCULAR | Status: DC | PRN
  Administered 2016-07-30: 100 ug via INTRAVENOUS
  Administered 2016-07-30 (×5): 50 ug via INTRAVENOUS

## 2016-07-30 MED ORDER — ONDANSETRON HCL 4 MG/2ML IJ SOLN
4.0000 mg | Freq: Once | INTRAMUSCULAR | Status: AC
  Administered 2016-07-30: 4 mg via INTRAVENOUS

## 2016-07-30 MED ORDER — ARTIFICIAL TEARS OP OINT
TOPICAL_OINTMENT | OPHTHALMIC | Status: DC | PRN
  Administered 2016-07-30: 1 via OPHTHALMIC

## 2016-07-30 MED ORDER — ESCITALOPRAM OXALATE 20 MG PO TABS
20.0000 mg | ORAL_TABLET | Freq: Every evening | ORAL | Status: DC
  Administered 2016-07-30: 20 mg via ORAL
  Filled 2016-07-30: qty 1

## 2016-07-30 MED ORDER — HYDROMORPHONE HCL 1 MG/ML IJ SOLN
1.0000 mg | INTRAMUSCULAR | Status: DC | PRN
  Administered 2016-07-30 – 2016-07-31 (×3): 1 mg via INTRAVENOUS
  Filled 2016-07-30 (×3): qty 1

## 2016-07-30 MED ORDER — THROMBIN 5000 UNITS EX SOLR
CUTANEOUS | Status: AC
  Filled 2016-07-30: qty 5000

## 2016-07-30 MED ORDER — CHLORHEXIDINE GLUCONATE CLOTH 2 % EX PADS: 6.0000 | MEDICATED_PAD | Freq: Once | CUTANEOUS | Status: DC

## 2016-07-30 MED ORDER — CEFAZOLIN SODIUM-DEXTROSE 2-4 GM/100ML-% IV SOLN
2.0000 g | Freq: Three times a day (TID) | INTRAVENOUS | Status: AC
  Administered 2016-07-30 – 2016-07-31 (×2): 2 g via INTRAVENOUS
  Filled 2016-07-30 (×2): qty 100

## 2016-07-30 MED ORDER — PHENOL 1.4 % MT LIQD: 1.0000 | OROMUCOSAL | Status: DC | PRN

## 2016-07-30 MED ORDER — DOCUSATE SODIUM 100 MG PO CAPS
100.0000 mg | ORAL_CAPSULE | Freq: Two times a day (BID) | ORAL | Status: DC
  Administered 2016-07-30: 100 mg via ORAL
  Filled 2016-07-30: qty 1

## 2016-07-30 MED ORDER — SM SALINE SOLUTION SOLN: 1.0000 [drp] | Freq: Three times a day (TID) | Status: DC | PRN

## 2016-07-30 MED ORDER — MIDAZOLAM HCL 5 MG/5ML IJ SOLN
INTRAMUSCULAR | Status: DC | PRN
  Administered 2016-07-30 (×2): 1 mg via INTRAVENOUS

## 2016-07-30 MED ORDER — HYDROMORPHONE HCL 2 MG PO TABS
2.0000 mg | ORAL_TABLET | ORAL | Status: DC | PRN
  Administered 2016-07-31: 2 mg via ORAL
  Filled 2016-07-30: qty 1

## 2016-07-30 MED ORDER — MENTHOL 3 MG MT LOZG
1.0000 | LOZENGE | OROMUCOSAL | Status: DC | PRN
Start: 2016-07-30 — End: 2016-07-31

## 2016-07-30 MED ORDER — HYDROCODONE-ACETAMINOPHEN 5-325 MG PO TABS
ORAL_TABLET | ORAL | Status: AC
  Filled 2016-07-30: qty 1

## 2016-07-30 MED ORDER — BISACODYL 10 MG RE SUPP
10.0000 mg | Freq: Every day | RECTAL | Status: DC | PRN
Start: 2016-07-30 — End: 2016-07-31

## 2016-07-30 MED ORDER — BACITRACIN ZINC 500 UNIT/GM EX OINT
TOPICAL_OINTMENT | CUTANEOUS | Status: DC | PRN
Start: 2016-07-30 — End: 2016-07-30
  Administered 2016-07-30: 1 via TOPICAL

## 2016-07-30 SURGICAL SUPPLY — 69 items
APL SKNCLS STERI-STRIP NONHPOA (GAUZE/BANDAGES/DRESSINGS) ×1
BAG DECANTER FOR FLEXI CONT (MISCELLANEOUS) ×3 IMPLANT
BENZOIN TINCTURE PRP APPL 2/3 (GAUZE/BANDAGES/DRESSINGS) ×4 IMPLANT
BIT DRILL NEURO 2X3.1 SFT TUCH (MISCELLANEOUS) ×1 IMPLANT
BLADE SURG 15 STRL LF DISP TIS (BLADE) ×1 IMPLANT
BLADE SURG 15 STRL SS (BLADE) ×3
BLADE ULTRA TIP 2M (BLADE) ×3 IMPLANT
BUR BARREL STRAIGHT FLUTE 4.0 (BURR) ×3 IMPLANT
BUR MATCHSTICK NEURO 3.0 LAGG (BURR) ×3 IMPLANT
CAGE PEEK VISTAS 11X14X6 (Cage) ×2 IMPLANT
CANISTER SUCT 3000ML PPV (MISCELLANEOUS) ×3 IMPLANT
CARTRIDGE OIL MAESTRO DRILL (MISCELLANEOUS) ×1 IMPLANT
CLIP TI MEDIUM 6 (CLIP) ×2 IMPLANT
CLOSURE WOUND 1/2 X4 (GAUZE/BANDAGES/DRESSINGS) ×1
COVER MAYO STAND STRL (DRAPES) ×3 IMPLANT
DEVICE FUSION VISTA 11X14X8MM (Spacer) IMPLANT
DIFFUSER DRILL AIR PNEUMATIC (MISCELLANEOUS) ×3 IMPLANT
DRAPE LAPAROTOMY 100X72 PEDS (DRAPES) ×3 IMPLANT
DRAPE MICROSCOPE LEICA (MISCELLANEOUS) IMPLANT
DRAPE POUCH INSTRU U-SHP 10X18 (DRAPES) ×3 IMPLANT
DRAPE SURG 17X23 STRL (DRAPES) ×6 IMPLANT
DRILL NEURO 2X3.1 SOFT TOUCH (MISCELLANEOUS) ×3
ELECT REM PT RETURN 9FT ADLT (ELECTROSURGICAL) ×3
ELECTRODE REM PT RTRN 9FT ADLT (ELECTROSURGICAL) ×1 IMPLANT
GAUZE SPONGE 4X4 12PLY STRL (GAUZE/BANDAGES/DRESSINGS) ×3 IMPLANT
GAUZE SPONGE 4X4 16PLY XRAY LF (GAUZE/BANDAGES/DRESSINGS) ×2 IMPLANT
GLOVE BIO SURGEON STRL SZ8 (GLOVE) ×3 IMPLANT
GLOVE BIO SURGEON STRL SZ8.5 (GLOVE) ×3 IMPLANT
GLOVE ECLIPSE 8.0 STRL XLNG CF (GLOVE) ×4 IMPLANT
GLOVE EXAM NITRILE LRG STRL (GLOVE) IMPLANT
GLOVE EXAM NITRILE XL STR (GLOVE) IMPLANT
GLOVE EXAM NITRILE XS STR PU (GLOVE) IMPLANT
GLOVE INDICATOR 7.5 STRL GRN (GLOVE) ×6 IMPLANT
GLOVE SS N UNI LF 7.0 STRL (GLOVE) ×6 IMPLANT
GLOVE SS N UNI LF 7.5 STRL (GLOVE) ×2 IMPLANT
GLOVE SURG SS PI 7.0 STRL IVOR (GLOVE) ×4 IMPLANT
GLOVE SURG SS PI 7.5 STRL IVOR (GLOVE) ×2 IMPLANT
GOWN STRL REUS W/ TWL LRG LVL3 (GOWN DISPOSABLE) IMPLANT
GOWN STRL REUS W/ TWL XL LVL3 (GOWN DISPOSABLE) IMPLANT
GOWN STRL REUS W/TWL LRG LVL3 (GOWN DISPOSABLE) ×3
GOWN STRL REUS W/TWL XL LVL3 (GOWN DISPOSABLE) ×3
HEMOSTAT POWDER KIT SURGIFOAM (HEMOSTASIS) ×5 IMPLANT
KIT BASIN OR (CUSTOM PROCEDURE TRAY) ×3 IMPLANT
KIT ROOM TURNOVER OR (KITS) ×3 IMPLANT
MARKER SKIN DUAL TIP RULER LAB (MISCELLANEOUS) ×3 IMPLANT
NDL SPNL 18GX3.5 QUINCKE PK (NEEDLE) ×1 IMPLANT
NEEDLE HYPO 22GX1.5 SAFETY (NEEDLE) ×3 IMPLANT
NEEDLE SPNL 18GX3.5 QUINCKE PK (NEEDLE) ×3 IMPLANT
NS IRRIG 1000ML POUR BTL (IV SOLUTION) ×3 IMPLANT
OIL CARTRIDGE MAESTRO DRILL (MISCELLANEOUS) ×3
PACK LAMINECTOMY NEURO (CUSTOM PROCEDURE TRAY) ×3 IMPLANT
PATTIES SURGICAL 1X1 (DISPOSABLE) ×4 IMPLANT
PEEK S VISTA 7X11X14 (Peek) ×2 IMPLANT
PIN DISTRACTION 14MM (PIN) ×10 IMPLANT
PLATE ANT CERV XTEND 2 LV 30 (Plate) ×2 IMPLANT
PUTTY KINEX BIOACTIVE 5CC (Bone Implant) ×2 IMPLANT
RUBBERBAND STERILE (MISCELLANEOUS) IMPLANT
SCREW XTD VAR 4.2 SELF TAP 12 (Screw) ×24 IMPLANT
SPONGE INTESTINAL PEANUT (DISPOSABLE) ×6 IMPLANT
SPONGE SURGIFOAM ABS GEL SZ50 (HEMOSTASIS) ×3 IMPLANT
STRIP CLOSURE SKIN 1/2X4 (GAUZE/BANDAGES/DRESSINGS) ×2 IMPLANT
SUT VIC AB 0 CT1 27 (SUTURE) ×6
SUT VIC AB 0 CT1 27XBRD ANTBC (SUTURE) ×1 IMPLANT
SUT VIC AB 3-0 SH 8-18 (SUTURE) ×3 IMPLANT
TAPE CLOTH SURG 4X10 WHT LF (GAUZE/BANDAGES/DRESSINGS) ×2 IMPLANT
TOWEL OR 17X24 6PK STRL BLUE (TOWEL DISPOSABLE) ×3 IMPLANT
TOWEL OR 17X26 10 PK STRL BLUE (TOWEL DISPOSABLE) ×3 IMPLANT
VISTA 11X14X8MM (Spacer) ×6 IMPLANT
WATER STERILE IRR 1000ML POUR (IV SOLUTION) ×3 IMPLANT

## 2016-07-30 NOTE — Anesthesia Preprocedure Evaluation (Signed)
Anesthesia Evaluation  Patient identified by MRN, date of birth, ID band Patient awake    Reviewed: Allergy & Precautions, NPO status , Patient's Chart, lab work & pertinent test results  History of Anesthesia Complications (+) PONV and history of anesthetic complications  Airway Mallampati: III  TM Distance: >3 FB Neck ROM: Limited  Mouth opening: Limited Mouth Opening  Dental  (+) Teeth Intact   Pulmonary sleep apnea ,    breath sounds clear to auscultation       Cardiovascular negative cardio ROS   Rhythm:Regular     Neuro/Psych  Headaches, Anxiety  Neuromuscular disease    GI/Hepatic GERD  ,  Endo/Other  Hypothyroidism   Renal/GU      Musculoskeletal  (+) Arthritis ,   Abdominal   Peds  Hematology   Anesthesia Other Findings   Reproductive/Obstetrics                             Anesthesia Physical Anesthesia Plan  ASA: II  Anesthesia Plan: General   Post-op Pain Management:    Induction: Intravenous  Airway Management Planned: Oral ETT and Video Laryngoscope Planned  Additional Equipment: None  Intra-op Plan:   Post-operative Plan: Extubation in OR  Informed Consent: I have reviewed the patients History and Physical, chart, labs and discussed the procedure including the risks, benefits and alternatives for the proposed anesthesia with the patient or authorized representative who has indicated his/her understanding and acceptance.   Dental advisory given  Plan Discussed with: CRNA and Surgeon  Anesthesia Plan Comments:         Anesthesia Quick Evaluation

## 2016-07-30 NOTE — Transfer of Care (Signed)
Immediate Anesthesia Transfer of Care Note  Patient: Lynn White  Procedure(s) Performed: Procedure(s): ANTERIOR CERVICAL DECOMPRESSION/DISCECTOMY FUSION, INTERBODY PROSTHESIS, PLATE CERVICAL FIVE-SIX, CERVICAL SIX-SEVEN (N/A)  Patient Location: PACU  Anesthesia Type:General  Level of Consciousness: awake, alert , oriented and patient cooperative  Airway & Oxygen Therapy: Patient Spontanous Breathing and Patient connected to nasal cannula oxygen  Post-op Assessment: Report given to RN and Post -op Vital signs reviewed and stable  Post vital signs: Reviewed and stable  Last Vitals:  Vitals:   07/30/16 0844 07/30/16 1530  BP: (!) 170/96   Pulse: 69   Resp: 20   Temp: 36.9 C (!) 38.6 C    Last Pain:  Vitals:   07/30/16 0958  TempSrc:   PainSc: 9       Patients Stated Pain Goal: 3 ([id number])  Complications: No apparent anesthesia complications

## 2016-07-30 NOTE — Progress Notes (Signed)
Patient ID: Lynn White, female   DOB: March 10, 1966, 50 y.o.   MRN: [id number] Subjective:  The patient is alert and pleasant. She is in no apparent distress. She looks well.  Objective: Vital signs in last 24 hours: Temp:  [98.5 F (36.9 C)-101.5 F (38.6 C)] 101.5 F (38.6 C) (12/18 1530) Pulse Rate:  [69] 69 (12/18 0844) Resp:  [20] 20 (12/18 0844) BP: (170)/(96) 170/96 (12/18 0844) SpO2:  [100 %] 100 % (12/18 0844) Weight:  [61.3 kg (135 lb 3.2 oz)] 61.3 kg (135 lb 3.2 oz) (12/18 0844)  Intake/Output from previous day: No intake/output data recorded. Intake/Output this shift: Total I/O In: 1700 [I.V.:1700] Out: 150 [Blood:150]  Physical exam the patient is alert and pleasant. She is moving all 4 extremities well.  The patient's dressing has a small bloodstained. There is no evidence of hematoma or shift.  Lab Results: No results for input(s): WBC, HGB, HCT, PLT in the last 72 hours. BMET No results for input(s): NA, K, CL, CO2, GLUCOSE, BUN, CREATININE, CALCIUM in the last 72 hours.  Studies/Results: Dg Cervical Spine 1 View  Result Date: 07/30/2016 CLINICAL DATA:  ACDF C5-6 C6-7 EXAM: CERVICAL SPINE 1 VIEW COMPARISON:  None. FINDINGS: ACDF with anterior plate and interbody bone graft at C5-6 and C6-7. Hardware in satisfactory position. IMPRESSION: ACDF C5-6 and C6-7. Electronically Signed   By: Franchot Gallo M.D.   On: 07/30/2016 15:25   Dg Cervical Spine 1 View  Result Date: 07/30/2016 CLINICAL DATA:  Surgery . EXAM: CERVICAL SPINE 1 VIEW COMPARISON:  No recent prior . FINDINGS: Intraoperative cervical spine image obtained. Surgical instruments noted over the anterior neck. Diffuse multilevel degenerative change. No evidence of fracture or dislocation. IMPRESSION: Intraoperative cervical spine image obtained. Electronically Signed   By: Marcello Moores  Register   On: 07/30/2016 13:29    Assessment/Plan: The patient is doing well. I spoke with the patient's husband. Dr. Cyndy Freeze is  kindly going to see the patient in my absence.  LOS: 0 days     Devion Chriscoe D 07/30/2016, 4:03 PM

## 2016-07-30 NOTE — Anesthesia Postprocedure Evaluation (Signed)
Anesthesia Post Note  Patient: Lynn White  Procedure(s) Performed: Procedure(s) (LRB): ANTERIOR CERVICAL DECOMPRESSION/DISCECTOMY FUSION, INTERBODY PROSTHESIS, PLATE CERVICAL FIVE-SIX, CERVICAL SIX-SEVEN (N/A)  Patient location during evaluation: PACU Anesthesia Type: General Level of consciousness: awake and alert and patient cooperative Pain management: pain level controlled Vital Signs Assessment: post-procedure vital signs reviewed and stable Respiratory status: spontaneous breathing and respiratory function stable Cardiovascular status: stable Anesthetic complications: no       Last Vitals:  Vitals:   07/30/16 1649 07/30/16 1711  BP:  (!) 150/85  Pulse:  81  Resp:  20  Temp: 36.6 C 37.1 C    Last Pain:  Vitals:   07/30/16 1600  TempSrc:   PainSc: 10-Worst pain ever                 Gianluca Chhim S

## 2016-07-30 NOTE — Op Note (Signed)
Brief history: The patient is a 50 year old white female who has complained of neck and arm pain consistent with a cervical radiculopathy. She has failed medical management and was worked up with a cervical myelo CT. This demonstrated spondylosis and foraminal stenosis most prominent at C5-6 and C6-7. I discussed the various treatment options with the patient including surgery. She has weighed the risks, benefits, and alternative surgery and decided to proceed with a C5-6 and C6-7 anterior cervical discectomy, fusion, and plating.  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 Earle Gell  Asst.: Dr. Cyndy Freeze  Anesthesia: Gen. endotracheal  Estimated blood loss: 125 mL  Drains: None  Complications: None  Description of procedure: The patient was brought to the operating room by the anesthesia team. General endotracheal anesthesia was induced. A roll was placed under the patient's shoulders to keep the neck in the neutral position. The patient's anterior cervical region was then prepared with Betadine scrub and Betadine solution. Sterile drapes were applied.  The area to be incised was then injected with Marcaine with epinephrine solution. I then used a scalpel to make a transverse incision in the patient's left anterior neck. I used the Metzenbaum scissors to divide the platysmal muscle and then to dissect medial to the sternocleidomastoid muscle, jugular vein, and carotid artery. I carefully dissected down towards the anterior cervical spine identifying the  esophagus and retracting it medially. Then using Kitner swabs to clear soft tissue from the anterior cervical spine. We then inserted a bent spinal needle into the upper exposed intervertebral disc space. We then obtained intraoperative radiographs confirm our location.  I then used electrocautery to detach the medial border of the longus colli muscle bilaterally from the C5-6 and C6-7 intervertebral disc spaces. I then inserted the Caspar self-retaining retractor underneath the longus colli muscle bilaterally to provide exposure.  We then incised the intervertebral disc at C6-7. We then performed a partial intervertebral discectomy with a pituitary forceps and the Karlin curettes. I then inserted distraction screws into the vertebral bodies at C6 and C7. We then distracted the interspace. We then used the high-speed drill to decorticate the vertebral endplates at [id number], to drill away the remainder of the intervertebral disc, to drill away some posterior spondylosis, and to thin out the posterior longitudinal ligament. I then incised ligament with the arachnoid knife. We then removed the ligament with a Kerrison punches undercutting the vertebral endplates and decompressing the thecal sac. We then performed foraminotomies about the bilateral C7 nerve roots. This completed the decompression at this level.  We then repeated this procedure and analogous fashion at C5-6 decompressing the thecal sac and the bilateral C6 nerve roots.  We now turned our to attention to the interbody fusion. We used the trial spacers to determine the appropriate size for the interbody prosthesis. We then pre-filled prosthesis with a combination of local morcellized autograft bone that we obtained during decompression as well as Kinnex bone graft extender. We then inserted the prosthesis into the distracted interspace at C5-6 and C6-7. We then removed the distraction screws. There was a good snug fit of the prosthesis in the  interspace.  Having completed the fusion we  now turned attention to the anterior spinal instrumentation. We used the high-speed drill to drill away some anterior spondylosis at the disc spaces so that the plate lay down flat. We selected the appropriate length titanium anterior cervical plate. We laid it along the anterior aspect of the vertebral bodies from C5-C7. We then drilled 12 mm holes at C5, C6 and C7. We then secured the plate to the vertebral bodies by placing two 12 mm self-tapping screws at C5, C6 and C7. We then obtained intraoperative radiograph. The demonstrating good position of the instrumentation. We therefore secured the screws the plate the locking each cam. This completed the instrumentation.  We then obtained hemostasis using bipolar electrocautery. We irrigated the wound out with bacitracin solution. We then removed the retractor. We inspected the esophagus for any damage. There was none apparent. We then reapproximated patient's platysmal muscle with interrupted 3-0 Vicryl suture. We then reapproximated the subcutaneous tissue with interrupted 3-0 Vicryl suture. The skin was reapproximated with Steri-Strips and benzoin. The wound was then covered with bacitracin ointment. A sterile dressing was applied. The drapes were removed. Patient was subsequently extubated by the anesthesia team and transported to the post anesthesia care unit in stable condition. All sponge instrument and needle counts were reportedly correct at the end of this case.

## 2016-07-30 NOTE — H&P (Signed)
Subjective: The patient is a 50 year old white female who has complained of neck and arm pain consistent with a cervical radiculopathy. She has failed medical management and was worked up with a cervical myelo CT. This demonstrated multilevel degenerative changes most prominent at C5-6 and C6-7. I discussed the various treatment options with the patient including surgery. She has weighed the risks, benefits, and alternatives to surgery and decided proceed with a C5-6 and C6-7 anterior cervical discectomy, fusion, and plating.   Past Medical History:  Diagnosis Date  . Anxiety    uses lexapro, buspar- admits that she has panic attacks - precipitated most by- feeling that she is not in control, work   . Arthritis    knees, elbows - OA?   Marland Kitchen Cancer ([place])    basal cell on her back, removed   . Complication of anesthesia 07/2015    at [facility]-given phenergan for nausea post op & she reports " they ;lost me", told that she was gray & that she was non-responsive.  Given CPAP post op  . Dysrhythmia    pt. told that her PCP told her that she has "extra beats" & sent for holter monitor with [facility] in [facility]. told that it was normal & she never met with a cardiologist    . GERD (gastroesophageal reflux disease)    precipitated by stress   . Headache    pain associated with neck spondylosis   . History of kidney stones    passed spontaneously  . Hypothyroidism   . PONV (postoperative nausea and vomiting)   . Sleep apnea    pt. has self diagnosed - sleep apnea , pt. remarks that insurance doesn't cover the cost of the study so she hasnot had the test    Past Surgical History:  Procedure Laterality Date  . ABDOMINAL HYSTERECTOMY    . BREAST IMPLANT EXCHANGE  2000  . BREAST IMPLANT REMOVAL  1990  . TONSILLECTOMY      Allergies  Allergen Reactions  . Phenergan [Promethazine Hcl] Other (See Comments)    Depressed respiration, pt. Reports that the nurses told her that she was gray    .  Erythromycin Nausea And Vomiting  . Oxycodone Itching  . Tramadol Other (See Comments)    hallucinations    Social History  Substance Use Topics  . Smoking status: Never Smoker  . Smokeless tobacco: Former Systems developer     Comment: Community education officer use in 2012  . Alcohol use No    History reviewed. No pertinent family history. Prior to Admission medications   Medication Sig Start Date End Date Taking? Authorizing Provider  busPIRone (BUSPAR) 10 MG tablet Take 10 mg by mouth 2 (two) times daily.   Yes Historical Provider, MD  diphenhydrAMINE (BENADRYL) 25 MG tablet Take 100-125 mg by mouth at bedtime.   Yes Historical Provider, MD  escitalopram (LEXAPRO) 20 MG tablet Take 20 mg by mouth every evening. 05/09/16  Yes Historical Provider, MD  estradiol (ESTRACE) 1 MG tablet Take 1 mg by mouth daily.   Yes Historical Provider, MD  gabapentin (NEURONTIN) 600 MG tablet Take 1,800 mg by mouth at bedtime. 07/10/16  Yes Historical Provider, MD  gabapentin (NEURONTIN) 800 MG tablet Take 1,200 mg by mouth at bedtime.   Yes Historical Provider, MD  HYDROcodone-acetaminophen (NORCO/VICODIN) 5-325 MG tablet Take 1 tablet by mouth every 6 (six) hours as needed for pain. 07/12/16  Yes Historical Provider, MD  ibuprofen (ADVIL,MOTRIN) 800 MG tablet Take 800 mg  by mouth 3 (three) times daily as needed for pain. 04/24/16  Yes Historical Provider, MD  levothyroxine (SYNTHROID, LEVOTHROID) 50 MCG tablet Take 50 mcg by mouth daily before breakfast.   Yes Historical Provider, MD  Soft Lens Products (SM SALINE SOLUTION) SOLN Apply 1-2 drops to eye 3 (three) times daily as needed (for dry/irritated contact lenses.).   Yes Historical Provider, MD  tiZANidine (ZANAFLEX) 4 MG tablet Take 8 mg by mouth every evening. 06/18/16  Yes Historical Provider, MD  ranitidine (ZANTAC) 150 MG tablet Take 150 mg by mouth 2 (two) times daily as needed for heartburn.    Historical Provider, MD     Review of Systems  Positive ROS: As  above  All other systems have been reviewed and were otherwise negative with the exception of those mentioned in the HPI and as above.  Objective: Vital signs in last 24 hours: Temp:  [98.5 F (36.9 C)] 98.5 F (36.9 C) (12/18 0844) Pulse Rate:  [69] 69 (12/18 0844) Resp:  [20] 20 (12/18 0844) BP: (170)/(96) 170/96 (12/18 0844) SpO2:  [100 %] 100 % (12/18 0844) Weight:  [61.3 kg (135 lb 3.2 oz)] 61.3 kg (135 lb 3.2 oz) (12/18 0844)  General Appearance: Alert, cooperative, no distress, Head: Normocephalic, without obvious abnormality, atraumatic Eyes: PERRL, conjunctiva/corneas clear, EOM's intact,    Ears: Normal  Throat: Normal  Neck: Supple, symmetrical, trachea midline, no adenopathy; thyroid: No enlargement/tenderness/nodules; no carotid bruit or JVD Back: Symmetric, no curvature, ROM normal, no CVA tenderness Lungs: Clear to auscultation bilaterally, respirations unlabored Heart: Regular rate and rhythm, no murmur, rub or gallop Abdomen: Soft, non-tender,, no masses, no organomegaly Extremities: Extremities normal, atraumatic, no cyanosis or edema Pulses: 2+ and symmetric all extremities Skin: Skin color, texture, turgor normal, no rashes or lesions  NEUROLOGIC:   Mental status: alert and oriented, no aphasia, good attention span, Fund of knowledge/ memory ok Motor Exam - grossly normal Sensory Exam - grossly normal Reflexes:  Coordination - grossly normal Gait - grossly normal Balance - grossly normal Cranial Nerves: I: smell Not tested  II: visual acuity  OS: Normal  OD: Normal   II: visual fields Full to confrontation  II: pupils Equal, round, reactive to light  III,VII: ptosis None  III,IV,VI: extraocular muscles  Full ROM  V: mastication Normal  V: facial light touch sensation  Normal  V,VII: corneal reflex  Present  VII: facial muscle function - upper  Normal  VII: facial muscle function - lower Normal  VIII: hearing Not tested  IX: soft palate elevation   Normal  IX,X: gag reflex Present  XI: trapezius strength  5/5  XI: sternocleidomastoid strength 5/5  XI: neck flexion strength  5/5  XII: tongue strength  Normal    Data Review Lab Results  Component Value Date   WBC 7.5 07/26/2016   HGB 11.6 (L) 07/26/2016   HCT 35.3 (L) 07/26/2016   MCV 91.9 07/26/2016   PLT 267 07/26/2016   Lab Results  Component Value Date   NA 139 07/26/2016   K 4.5 07/26/2016   CL 105 07/26/2016   CO2 27 07/26/2016   BUN 16 07/26/2016   CREATININE 0.87 07/26/2016   GLUCOSE 87 07/26/2016   No results found for: INR, PROTIME  Assessment/Plan: 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 We have discussed the various treatment options including surgery. I have described the surgical  treatment option of a C5-6 and C6-7 anterior cervical discectomy, fusion, and plating. I have shown her surgical models. We have discussed the risks, benefits, alternatives, expected postoperative course, and likelihood of achieving her goals with surgery. I have answered all the patient's questions. She has decided to proceed with surgery.   Jhordyn Hoopingarner D 07/30/2016 11:12 AM

## 2016-07-31 ENCOUNTER — Encounter (HOSPITAL_COMMUNITY): Payer: Self-pay | Admitting: Neurosurgery

## 2016-07-31 DIAGNOSIS — M50122 Cervical disc disorder at C5-C6 level with radiculopathy: Secondary | ICD-10-CM | POA: Diagnosis not present

## 2016-07-31 MED ORDER — HYDROMORPHONE HCL 2 MG PO TABS: 2.0000 mg | ORAL_TABLET | ORAL | 0 refills | Status: AC | PRN

## 2016-07-31 MED FILL — Thrombin For Soln 5000 Unit: CUTANEOUS | Qty: 5000 | Status: AC

## 2016-07-31 NOTE — Progress Notes (Signed)
Pt feeling much better and is ready for D/C. Pt and husband given D/C instructions with Rx, verbal understanding was provided. Pt's incision is clean and dry with no sign of infection. Pt's IV was removed prior to D/C. Pt D/C'd home via wheelchair @ 1050 per MD order. Pt is stable @ D/C and has no other needs at this time. Holli Humbles, RN

## 2016-07-31 NOTE — Discharge Summary (Signed)
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  They were discharged in stable condition.  Follow up: 3 weeks  Allergies as of 07/31/2016      Reactions   Phenergan [promethazine Hcl] Other (See Comments)   Depressed respiration, pt. Reports that the nurses told her that she was gray     Erythromycin Nausea And Vomiting   Oxycodone Itching   Tramadol Other (See Comments)   hallucinations      Medication List    STOP taking these medications   ibuprofen 800 MG tablet Commonly known as:  ADVIL,MOTRIN     TAKE these medications   busPIRone 10 MG tablet Commonly known as:  BUSPAR Take 10 mg by mouth 2 (two) times daily.   diphenhydrAMINE 25 MG tablet Commonly known as:  BENADRYL Take 100-125 mg by mouth at bedtime.   escitalopram 20 MG tablet Commonly known as:  LEXAPRO Take 20 mg by mouth every evening.   estradiol 1 MG tablet Commonly known as:  ESTRACE Take 1 mg by mouth daily.   gabapentin 600 MG tablet Commonly known as:  NEURONTIN Take 1,800 mg by mouth at bedtime.   HYDROcodone-acetaminophen 5-325 MG tablet Commonly known as:  NORCO/VICODIN Take 1 tablet by mouth every 6 (six) hours as needed for pain.   HYDROmorphone 2 MG tablet Commonly known as:  DILAUDID Take 1-2 tablets (2-4 mg total) by mouth every 4 (four)  hours as needed for moderate pain or severe pain.   levothyroxine 50 MCG tablet Commonly known as:  SYNTHROID, LEVOTHROID Take 50 mcg by mouth daily before breakfast.   ranitidine 150 MG tablet Commonly known as:  ZANTAC Take 150 mg by mouth 2 (two) times daily as needed for heartburn.   SM SALINE SOLUTION Soln Apply 1-2 drops to eye 3 (three) times daily as needed (for dry/irritated contact lenses.).   tiZANidine 4 MG tablet Commonly known as:  ZANAFLEX Take 8 mg by mouth every evening.

## 2016-08-24 ENCOUNTER — Encounter: Payer: Self-pay | Admitting: Physician Assistant

## 2017-06-27 ENCOUNTER — Encounter (INDEPENDENT_AMBULATORY_CARE_PROVIDER_SITE_OTHER): Payer: Self-pay | Admitting: Orthopedic Surgery

## 2017-06-27 ENCOUNTER — Ambulatory Visit (INDEPENDENT_AMBULATORY_CARE_PROVIDER_SITE_OTHER): Payer: No Typology Code available for payment source | Admitting: Orthopedic Surgery

## 2017-06-27 ENCOUNTER — Ambulatory Visit (INDEPENDENT_AMBULATORY_CARE_PROVIDER_SITE_OTHER): Payer: BLUE CROSS/BLUE SHIELD | Admitting: Orthopedic Surgery

## 2017-06-27 DIAGNOSIS — M7502 Adhesive capsulitis of left shoulder: Secondary | ICD-10-CM

## 2017-06-27 MED ORDER — LIDOCAINE HCL 1 % IJ SOLN
5.0000 mL | INTRAMUSCULAR | Status: AC | PRN
  Administered 2017-06-27: 5 mL

## 2017-06-27 MED ORDER — BUPIVACAINE HCL 0.5 % IJ SOLN
9.0000 mL | INTRAMUSCULAR | Status: AC | PRN
  Administered 2017-06-27: 9 mL via INTRA_ARTICULAR

## 2017-06-27 MED ORDER — METHYLPREDNISOLONE ACETATE 40 MG/ML IJ SUSP
40.0000 mg | INTRAMUSCULAR | Status: AC | PRN
  Administered 2017-06-27: 40 mg via INTRA_ARTICULAR

## 2017-06-27 NOTE — Progress Notes (Signed)
Office Visit Note   Patient: Lynn White           Date of Birth: [birth year]           MRN: [id number] Visit Date: 06/27/2017 Requested by: Daylene Posey, [address], [place] [address] PCP: Daylene Posey, FNP  Subjective: Chief Complaint  Patient presents with  . Left Shoulder - Pain    HPI: Lynn White is a 51 year old female with left shoulder pain.  Denies any history of injury.  Reports pain for a year but it's been much worse since August.  States that she has decreased range of motion and she is unable to lift it and can't really move outward with the shoulder.  Takes ibuprofen daily.  Has history of cervical spine fusion in 2017.  Reports some pain radiating down the arm but not below the elbow.  Denies any numbness and tingling.  She had shoulder radiographs done at [facility] and was advised that the radiographs are normal.  She works as an Web designer.              ROS: All systems reviewed are negative as they relate to the chief complaint within the history of present illness.  Patient denies  fevers or chills.   Assessment & Plan: Visit Diagnoses:  1. Adhesive capsulitis of left shoulder     Plan: Impression is adhesive capsulitis left shoulder.  Rotator cuff strength feels intact.  She does have significant restriction of passive range of motion.  Plan is formal physical therapy with intra-articular cortisone injection performed today.  Follow-up in 8 weeks for clinical recheck.  Advised her to obtain door shoulder pulley.  Discussed natural history and surgical treatment options for this problem should they become necessary.  See her back in 8 weeks for clinical recheck.  Continue ibuprofen twice a day.  Follow-Up Instructions: Return in about 8 weeks (around 08/22/2017).   Orders:  No orders of the defined types were placed in this encounter.  No orders of the defined types were placed in this encounter.     Procedures: Large  Joint Inj: L glenohumeral on 06/27/2017 5:17 PM Indications: diagnostic evaluation and pain Details: 18 G 1.5 in needle, posterior approach  Arthrogram: No  Medications: 9 mL bupivacaine 0.5 %; 40 mg methylPREDNISolone acetate 40 MG/ML; 5 mL lidocaine 1 % Outcome: tolerated well, no immediate complications Procedure, treatment alternatives, risks and benefits explained, specific risks discussed. Consent was given by the patient. Immediately prior to procedure a time out was called to verify the correct patient, procedure, equipment, support staff and site/side marked as required. Patient was prepped and draped in the usual sterile fashion.       Clinical Data: No additional findings.  Objective: Vital Signs: LMP 11/11/2011   Physical Exam:   Constitutional: Patient appears well-developed HEENT:  Head: Normocephalic Eyes:EOM are normal Neck: Normal range of motion Cardiovascular: Normal rate Pulmonary/chest: Effort normal Neurologic: Patient is alert Skin: Skin is warm Psychiatric: Patient has normal mood and affect    Ortho Exam: Orthopedic exam demonstrates pretty good cervical spine range of motion considering she's had effusion.  She has flexion to about 1 inch and chest extension is about 35 past neutral rotation is about 45 bilaterally.  Patient has 5+ out of 5 grip EPL FPL interosseous wrist flexion-extension biceps triceps and deltoid strength.  Patient has restricted passive range of motion of the left shoulder at 15 of abduction to 40  compared to 85 on the right.  Isolated glenohumeral abduction is 110 on the right compared to 50 on the left.  Rotator cuff strength is intact.  Forward flexion is about 80 on the left compared to 180 on the right.  No other masses lymph adenopathy or skin changes noted in the shoulder girdle region  Specialty Comments:  No specialty comments available.  Imaging: No results found.   PMFS History: Patient Active Problem List    Diagnosis Date Noted  . Cervical spondylosis with radiculopathy 07/30/2016  . Hypothyroidism 10/21/2011  . Peri-menopausal 10/21/2011  . Post traumatic stress disorder (PTSD) 10/21/2011   Past Medical History:  Diagnosis Date  . Anemia   . Anxiety   . Anxiety    uses lexapro, buspar- admits that she has panic attacks - precipitated most by- feeling that she is not in control, work   . Arthritis    knees, elbows - OA?   Marland Kitchen Asthma   . Cancer ([place])    basal cell on her back, removed   . Complication of anesthesia 07/2015    at [facility]-given phenergan for nausea post op & she reports " they ;lost me", told that she was gray & that she was non-responsive.  Given CPAP post op  . Depression   . Dysrhythmia    pt. told that her PCP told her that she has "extra beats" & sent for holter monitor with [facility] in [facility]. told that it was normal & she never met with a cardiologist    . GERD (gastroesophageal reflux disease)    precipitated by stress   . Headache    pain associated with neck spondylosis   . History of kidney stones    passed spontaneously  . Hypothyroidism   . PONV (postoperative nausea and vomiting)   . Sleep apnea    pt. has self diagnosed - sleep apnea , pt. remarks that insurance doesn't cover the cost of the study so she hasnot had the test  . Thyroid disease     Family History  Problem Relation Age of Onset  . Cancer Father        metastatic skin cancer  . Cancer Sister        breast  . Diabetes Maternal Grandmother   . Cancer Sister        breast  . Cancer Mother        breast cancer    Past Surgical History:  Procedure Laterality Date  . ABDOMINAL EXPLORATION SURGERY  2007  . ABDOMINAL HYSTERECTOMY    . ANTERIOR CERVICAL DECOMP/DISCECTOMY FUSION N/A 07/30/2016   Procedure: ANTERIOR CERVICAL DECOMPRESSION/DISCECTOMY FUSION, INTERBODY PROSTHESIS, PLATE CERVICAL FIVE-SIX, CERVICAL SIX-SEVEN;  Surgeon: Newman Pies, MD;  Location: [facility];  Service:  Neurosurgery;  Laterality: N/A;  . BREAST IMPLANT EXCHANGE  2000  . BREAST IMPLANT REMOVAL  1990  . BREAST SURGERY  1990, 2000   Augmentation  . COSMETIC SURGERY    . TONSILLECTOMY    . TONSILLECTOMY AND ADENOIDECTOMY     Social History   Occupational History  . Occupation: Administrative Asst.  Tobacco Use  . Smoking status: Never Smoker  . Smokeless tobacco: Former Systems developer  . Tobacco comment: electronic cigarettte use in 2012  Substance and Sexual Activity  . Alcohol use: No  . Drug use: No  . Sexual activity: Yes

## 2017-08-22 ENCOUNTER — Ambulatory Visit (INDEPENDENT_AMBULATORY_CARE_PROVIDER_SITE_OTHER): Payer: BLUE CROSS/BLUE SHIELD | Admitting: Orthopedic Surgery

## 2019-06-30 ENCOUNTER — Other Ambulatory Visit: Payer: Self-pay

## 2019-06-30 ENCOUNTER — Ambulatory Visit: Payer: BLUE CROSS/BLUE SHIELD | Admitting: Family Medicine

## 2019-06-30 ENCOUNTER — Encounter: Payer: Self-pay | Admitting: Family Medicine

## 2019-06-30 VITALS — BP 121/82 | HR 84 | Ht 66.0 in | Wt 132.0 lb

## 2019-06-30 DIAGNOSIS — M5441 Lumbago with sciatica, right side: Secondary | ICD-10-CM | POA: Diagnosis not present

## 2019-06-30 DIAGNOSIS — M4722 Other spondylosis with radiculopathy, cervical region: Secondary | ICD-10-CM | POA: Diagnosis not present

## 2019-06-30 NOTE — Patient Instructions (Signed)
Nice to meet you Please try the exercises  Please try heat on the back   Please send me a message in MyChart with any questions or updates.  Please see me back in 2 weeks.   --Dr. Raeford Razor

## 2019-06-30 NOTE — Assessment & Plan Note (Signed)
Acute exacerbating radiculopathy type symptoms down the right arm.  Has a history of surgery of the cervical spine.   -Can continue prednisone and muscle relaxer. -Counseled she can increase the tramadol if needed. -Counseled on home exercise therapy and supportive care. -Provided work note. -May need to consider updated imaging and physical therapy.

## 2019-06-30 NOTE — Assessment & Plan Note (Signed)
Acute in nature.  Seems to be a component of radiculopathy as well as spasming in the lower back. -Can continue prednisone and muscle relaxer. -Can increase tramadol 100 mg twice daily if needed. -Counseled on home exercise therapy and supportive care. -Provided work note.

## 2019-06-30 NOTE — Progress Notes (Signed)
Lynn White - 53 y.o. female MRN [id number]  Date of birth: Jan 01, 1966  SUBJECTIVE:  Including CC & ROS.  Chief Complaint  Patient presents with   Neck Pain    right side    Lynn White is a 53 y.o. female that is presenting with acute right arm pain and acute right leg pain.  She has a history of surgery of the neck from a few years ago.  Her symptoms have been ongoing for about 1 week.  She describes the pain in the arm on the posterior aspect.  It also encompasses in a glovelike distribution in the hand.  She denies any specific inciting event.  Has been taking tramadol and muscle relaxer and prednisone with limited improvement.  She also reports right leg pain.  This is ongoing for the past week.  The pain is severe and constant nature.  She only gets relief from lying down on her back.  She feels most of the pain in the lower back as well as the lateral lower leg.  She denies any history of lower back surgery.  Denies any specific inciting event.  The pain can be sharp and stabbing.  Limited to no improvement with medications thus far.  No history of formal physical therapy.  Review of the myelogram from 2017 shows moderate left foraminal stenosis at C3-4.  Shows mild central canal narrowing at C4-5.  Shows moderate central and bilateral foraminal stenosis at C5-6.  C6-7 with moderate right central canal stenosis with severe left foraminal stenosis and moderate right foraminal stenosis.  Shallow central disc protrusion at C7-T1   Review of Systems  Constitutional: Negative for fever.  HENT: Negative for congestion.   Respiratory: Negative for cough.   Cardiovascular: Negative for chest pain.  Gastrointestinal: Negative for abdominal pain.  Musculoskeletal: Positive for back pain.  Skin: Negative for color change.  Neurological: Negative for weakness.  Hematological: Negative for adenopathy.    HISTORY: Past Medical, Surgical, Social, and Family History Reviewed & Updated per EMR.     Pertinent Historical Findings include:  Past Medical History:  Diagnosis Date   Anemia    Anxiety    Anxiety    uses lexapro, buspar- admits that she has panic attacks - precipitated most by- feeling that she is not in control, work    Arthritis    knees, elbows - OA?    Asthma    Cancer ([place])    basal cell on her back, removed    Complication of anesthesia 07/2015    at [facility]-given phenergan for nausea post op & she reports " they ;lost me", told that she was gray & that she was non-responsive.  Given CPAP post op   Depression    Dysrhythmia    pt. told that her PCP told her that she has "extra beats" & [facility] [place]. told that it was normal & she never met with a cardiologist     GERD (gastroesophageal reflux disease)    precipitated by stress    Headache    pain associated with neck spondylosis    History of kidney stones    passed spontaneously   Hypothyroidism    PONV (postoperative nausea and vomiting)    Sleep apnea    pt. has self diagnosed - sleep apnea , pt. remarks that insurance doesn't cover the cost of the study so she hasnot had the test   Thyroid disease  Past Surgical History:  Procedure Laterality Date   ABDOMINAL EXPLORATION SURGERY  2007   ABDOMINAL HYSTERECTOMY     ANTERIOR CERVICAL DECOMP/DISCECTOMY FUSION N/A 07/30/2016   Procedure: ANTERIOR CERVICAL DECOMPRESSION/DISCECTOMY FUSION, INTERBODY PROSTHESIS, PLATE CERVICAL FIVE-SIX, CERVICAL SIX-SEVEN;  Surgeon: Newman Pies, MD;  Location: [place];  Service: Neurosurgery;  Laterality: N/A;   BREAST IMPLANT EXCHANGE  2000   BREAST IMPLANT REMOVAL  1990   BREAST SURGERY  1990, 2000   Augmentation   COSMETIC SURGERY     TONSILLECTOMY     TONSILLECTOMY AND ADENOIDECTOMY      Allergies  Allergen Reactions   Azithromycin Nausea Only   Phenergan [Promethazine Hcl] Other (See Comments)    Depressed respiration, pt. Reports that the nurses  told her that she was gray     Erythromycin Nausea And Vomiting   Oxycodone Itching   Tramadol Other (See Comments)    hallucinations    Family History  Problem Relation Age of Onset   Cancer Father        metastatic skin cancer   Cancer Sister        breast   Diabetes Maternal Grandmother    Cancer Sister        breast   Cancer Mother        breast cancer     Social History   Socioeconomic History   Marital status: Married    Spouse name: Not on file   Number of children: 0   Years of education: Not on file   Highest education level: Not on file  Occupational History   Occupation: Administrative Asst.  Social Needs   Financial resource strain: Not on file   Food insecurity    Worry: Not on file    Inability: Not on file   Transportation needs    Medical: Not on file    Non-medical: Not on file  Tobacco Use   Smoking status: Never Smoker   Smokeless tobacco: Former Systems developer   Tobacco comment: electronic cigarettte use in 2012  Substance and Sexual Activity   Alcohol use: No   Drug use: No   Sexual activity: Yes  Lifestyle   Physical activity    Days per week: Not on file    Minutes per session: Not on file   Stress: Not on file  Relationships   Social connections    Talks on phone: Not on file    Gets together: Not on file    Attends religious service: Not on file    Active member of club or organization: Not on file    Attends meetings of clubs or organizations: Not on file    Relationship status: Not on file   Intimate partner violence    Fear of current or ex partner: Not on file    Emotionally abused: Not on file    Physically abused: Not on file    Forced sexual activity: Not on file  Other Topics Concern   Not on file  Social History Narrative   ** Merged History Encounter **       Patient is divorced 01/2012. Lives with a roommate. Education: [employer]. Exercise: Yes.     PHYSICAL EXAM:  VS: BP 121/82    Pulse  84    Ht 5' 6"  (1.676 m)    Wt 132 lb (59.9 kg)    LMP 11/11/2011    BMI 21.31 kg/m  Physical Exam Gen: NAD, alert, cooperative with exam, well-appearing ENT: normal  lips, normal nasal mucosa,  Eye: normal EOM, normal conjunctiva and lids CV:  no edema, +2 pedal pulses   Resp: no accessory muscle use, non-labored,  Skin: no rashes, no areas of induration  Neuro: normal tone, normal sensation to touch Psych:  normal insight, alert and oriented MSK:  Neck: Normal flexion and extension. Some pain with lateral rotation. Normal shoulder range of motion. Normal strength resistance with shrug. Normal strength resistance in upper extremity. Normal pincer grasp. Normal grip strength. No signs of atrophy. Back: Some tenderness to palpation the paraspinal muscles in the lumbar spine. Normal internal and external rotation. Normal strength resistance with hip flexion, knee flexion and extension, plantarflexion and dorsiflexion. +2 deep tendon reflexes at the patella. Positive straight leg raise. Neurovascularly intact     ASSESSMENT & PLAN:   Cervical spondylosis with radiculopathy Acute exacerbating radiculopathy type symptoms down the right arm.  Has a history of surgery of the cervical spine.   -Can continue prednisone and muscle relaxer. -Counseled she can increase the tramadol if needed. -Counseled on home exercise therapy and supportive care. -Provided work note. -May need to consider updated imaging and physical therapy.  Acute right-sided low back pain with right-sided sciatica Acute in nature.  Seems to be a component of radiculopathy as well as spasming in the lower back. -Can continue prednisone and muscle relaxer. -Can increase tramadol 100 mg twice daily if needed. -Counseled on home exercise therapy and supportive care. -Provided work note.

## 2019-07-02 ENCOUNTER — Telehealth: Payer: Self-pay | Admitting: Family Medicine

## 2019-07-02 NOTE — Telephone Encounter (Signed)
Patient requesting Rx for Tramadol. States she will be out of medication after today.   Pharmacy: [facility]

## 2019-07-06 MED ORDER — TRAMADOL HCL 50 MG PO TABS
50.0000 mg | ORAL_TABLET | Freq: Two times a day (BID) | ORAL | 0 refills | Status: AC | PRN
Start: 2019-07-06 — End: ?

## 2019-07-06 NOTE — Telephone Encounter (Signed)
Tramadol sent in.   Rosemarie Ax, MD [facility] 07/06/2019, 9:13 AM

## 2019-07-14 ENCOUNTER — Encounter: Payer: Self-pay | Admitting: Family Medicine

## 2019-07-14 ENCOUNTER — Other Ambulatory Visit: Payer: Self-pay

## 2019-07-14 ENCOUNTER — Ambulatory Visit: Payer: BLUE CROSS/BLUE SHIELD | Admitting: Family Medicine

## 2019-07-14 VITALS — BP 135/71 | HR 73 | Ht 67.0 in | Wt 135.0 lb

## 2019-07-14 DIAGNOSIS — M4722 Other spondylosis with radiculopathy, cervical region: Secondary | ICD-10-CM

## 2019-07-14 DIAGNOSIS — M5441 Lumbago with sciatica, right side: Secondary | ICD-10-CM

## 2019-07-14 MED ORDER — DICLOFENAC SODIUM 75 MG PO TBEC: 75.0000 mg | DELAYED_RELEASE_TABLET | Freq: Two times a day (BID) | ORAL | 1 refills | Status: DC | PRN

## 2019-07-14 MED ORDER — HYDROCODONE-ACETAMINOPHEN 5-325 MG PO TABS: 1.0000 | ORAL_TABLET | Freq: Every evening | ORAL | 0 refills | Status: DC | PRN

## 2019-07-14 MED ORDER — DICLOFENAC SODIUM 75 MG PO TBEC: 75.0000 mg | DELAYED_RELEASE_TABLET | Freq: Two times a day (BID) | ORAL | 1 refills | Status: AC | PRN

## 2019-07-14 NOTE — Progress Notes (Signed)
Lynn White - 53 y.o. female MRN [id number]  Date of birth: [birth year]  SUBJECTIVE:  Including CC & ROS.  Chief Complaint  Patient presents with  . Follow-up    follow up for low back    Lynn White is a 53 y.o. female that is presenting with her radiculopathy type symptoms.  She has noted some improvement as she is not and has severe pain.  She does experience severe symptoms intermittently.  This occurs when she is busy at work or more stress than usual.  She has to lie down on a heating pad to help with her back.  She experiences neck pain as well as right-sided radicular symptoms.  She experiences low back pain as well as right-sided radicular symptoms in the lower extremity.  She gets limited improvement with the tramadol and did have some auditory hallucinations.  She cannot take ibuprofen as she took it chronically for 6 years.  She does take Tylenol which helps minimally.  She has been trying the home exercises.  She has been trying to adjust her work environment.  She is not having to use a walker.  Denies any numbness or tingling    Review of Systems  Constitutional: Negative for fever.  HENT: Negative for congestion.   Respiratory: Negative for cough.   Cardiovascular: Negative for chest pain.  Gastrointestinal: Negative for abdominal distention.  Musculoskeletal: Positive for back pain and neck pain.  Skin: Negative for color change.  Neurological: Negative for weakness.  Hematological: Negative for adenopathy.    HISTORY: Past Medical, Surgical, Social, and Family History Reviewed & Updated per EMR.   Pertinent Historical Findings include:  Past Medical History:  Diagnosis Date  . Anemia   . Anxiety   . Anxiety    uses lexapro, buspar- admits that she has panic attacks - precipitated most by- feeling that she is not in control, work   . Arthritis    knees, elbows - OA?   Marland Kitchen Asthma   . Cancer ([place])    basal cell on her back, removed   . Complication of anesthesia  07/2015    at [facility]-given phenergan for nausea post op & she reports " they ;lost me", told that she was gray & that she was non-responsive.  Given CPAP post op  . Depression   . Dysrhythmia    pt. told that her PCP told her that she has "extra beats" & sent for holter monitor with [facility] in [place]. told that it was normal & she never met with a cardiologist    . GERD (gastroesophageal reflux disease)    precipitated by stress   . Headache    pain associated with neck spondylosis   . History of kidney stones    passed spontaneously  . Hypothyroidism   . PONV (postoperative nausea and vomiting)   . Sleep apnea    pt. has self diagnosed - sleep apnea , pt. remarks that insurance doesn't cover the cost of the study so she hasnot had the test  . Thyroid disease     Past Surgical History:  Procedure Laterality Date  . ABDOMINAL EXPLORATION SURGERY  2007  . ABDOMINAL HYSTERECTOMY    . ANTERIOR CERVICAL DECOMP/DISCECTOMY FUSION N/A 07/30/2016   Procedure: ANTERIOR CERVICAL DECOMPRESSION/DISCECTOMY FUSION, INTERBODY PROSTHESIS, PLATE CERVICAL FIVE-SIX, CERVICAL SIX-SEVEN;  Surgeon: Newman Pies, MD;  Location: [place];  Service: Neurosurgery;  Laterality: N/A;  . BREAST IMPLANT EXCHANGE  2000  . BREAST IMPLANT REMOVAL  [place], 2000   Augmentation  . COSMETIC SURGERY    . TONSILLECTOMY    . TONSILLECTOMY AND ADENOIDECTOMY      Allergies  Allergen Reactions  . Azithromycin Nausea Only  . Phenergan [Promethazine Hcl] Other (See Comments)    Depressed respiration, pt. Reports that the nurses told her that she was gray    . Erythromycin Nausea And Vomiting  . Oxycodone Itching  . Tramadol Other (See Comments)    hallucinations    Family History  Problem Relation Age of Onset  . Cancer Father        metastatic skin cancer  . Cancer Sister        breast  . Diabetes Maternal Grandmother   . Cancer Sister        breast  . Cancer Mother        breast cancer      Social History   Socioeconomic History  . Marital status: Married    Spouse name: Not on file  . Number of children: 0  . Years of education: Not on file  . Highest education level: Not on file  Occupational History  . Occupation: Administrative Asst.  Social Needs  . Financial resource strain: Not on file  . Food insecurity    Worry: Not on file    Inability: Not on file  . Transportation needs    Medical: Not on file    Non-medical: Not on file  Tobacco Use  . Smoking status: Never Smoker  . Smokeless tobacco: Former Systems developer  . Tobacco comment: electronic cigarettte use in 2012  Substance and Sexual Activity  . Alcohol use: No  . Drug use: No  . Sexual activity: Yes  Lifestyle  . Physical activity    Days per week: Not on file    Minutes per session: Not on file  . Stress: Not on file  Relationships  . Social Herbalist on phone: Not on file    Gets together: Not on file    Attends religious service: Not on file    Active member of club or organization: Not on file    Attends meetings of clubs or organizations: Not on file    Relationship status: Not on file  . Intimate partner violence    Fear of current or ex partner: Not on file    Emotionally abused: Not on file    Physically abused: Not on file    Forced sexual activity: Not on file  Other Topics Concern  . Not on file  Social History Narrative   ** Merged History Encounter **       Patient is divorced 01/2012. Lives with a roommate. Education: [employer]. Exercise: Yes.     PHYSICAL EXAM:  VS: BP 135/71   Pulse 73   Ht 5' 7"  (1.702 m)   Wt 135 lb (61.2 kg)   LMP 11/11/2011   BMI 21.14 kg/m  Physical Exam Gen: NAD, alert, cooperative with exam, well-appearing ENT: normal lips, normal nasal mucosa,  Eye: normal EOM, normal conjunctiva and lids CV:  no edema, +2 pedal pulses   Resp: no accessory muscle use, non-labored,  Skin: no rashes, no areas of induration  Neuro: normal  tone, normal sensation to touch Psych:  normal insight, alert and oriented MSK:  Neck: Normal range of motion in flexion extension. Normal lateral rotation. Normal strength resistance with shrug. Normal shoulder range of motion. Normal grip  strength. No signs of atrophy. Back: Tenderness to palpation in the paraspinal muscles of the lumbar spine. No tenderness to palpation of the midline lumbar spine. Normal internal and external rotation of the hips. Normal strength resistance with hip flexion. Normal strength resistance with plantarflexion and dorsiflexion. Normal gait. Positive straight leg raise Neurovascularly intact     ASSESSMENT & PLAN:   Cervical spondylosis with radiculopathy Acutely still occurring but has improved somewhat.  She is unable to take gabapentin.  Reports hallucinations with the tramadol.  Has good range of motion today.  Does have a history of fusion -Counseled on home exercise therapy and supportive care. -Initiate diclofenac. -Counseled on Norco and its use. -Could consider physical therapy or advanced imaging.   Acute right-sided low back pain with right-sided sciatica Having localized low back pain that more likely spasm related.  Does have intermittent radicular type symptoms.  Unable to tolerate gabapentin.  Tramadol seems to cause side effects as well. -Initiate diclofenac. -Provide Norco for severe pain.  Counseled on its use and limited use -Counseled on home exercise therapy and supportive care. -She can continue her muscle relaxer. -Could consider physical therapy or advanced imaging.

## 2019-07-14 NOTE — Patient Instructions (Signed)
Good to see you Please take the diclofenac on a more regular basis. Please take the Norco for severe pain.  Please hold the Tylenol. Please continue the exercises and heat. Please try to stay active and avoid sitting for long periods of time Please send me a message in MyChart with any questions or updates.  Please see me back in 4 weeks.   --Dr. Raeford Razor

## 2019-07-15 NOTE — Assessment & Plan Note (Signed)
Acutely still occurring but has improved somewhat.  She is unable to take gabapentin.  Reports hallucinations with the tramadol.  Has good range of motion today.  Does have a history of fusion -Counseled on home exercise therapy and supportive care. -Initiate diclofenac. -Counseled on Norco and its use. -Could consider physical therapy or advanced imaging.

## 2019-07-15 NOTE — Assessment & Plan Note (Signed)
Having localized low back pain that more likely spasm related.  Does have intermittent radicular type symptoms.  Unable to tolerate gabapentin.  Tramadol seems to cause side effects as well. -Initiate diclofenac. -Provide Norco for severe pain.  Counseled on its use and limited use -Counseled on home exercise therapy and supportive care. -She can continue her muscle relaxer. -Could consider physical therapy or advanced imaging.

## 2019-08-11 ENCOUNTER — Ambulatory Visit: Payer: BLUE CROSS/BLUE SHIELD | Admitting: Family Medicine

## 2019-08-19 ENCOUNTER — Other Ambulatory Visit: Payer: Self-pay

## 2019-08-19 ENCOUNTER — Ambulatory Visit: Payer: BLUE CROSS/BLUE SHIELD | Admitting: Family Medicine

## 2019-08-19 DIAGNOSIS — M4722 Other spondylosis with radiculopathy, cervical region: Secondary | ICD-10-CM

## 2019-08-19 DIAGNOSIS — M5441 Lumbago with sciatica, right side: Secondary | ICD-10-CM

## 2019-08-19 NOTE — Patient Instructions (Signed)
Good to see you Please continue the heat  Please continue the exercises   Please send me a message in MyChart with any questions or updates.  Please see me back in 8 weeks.   --Dr. Raeford Razor

## 2019-08-19 NOTE — Progress Notes (Signed)
Lynn White - 54 y.o. female MRN [id number]  Date of birth: [birth year]  SUBJECTIVE:  Including CC & ROS.  No chief complaint on file.   Lynn White is a 54 y.o. female that is presenting with worsening low back pain and neck pain.  Has had low back pain with some radicular symptoms as well as neck pain with radicular symptoms.  Symptoms be associated with stress that she encounters at work.  Does get improvement with diclofenac and occasional Norco.  Denies any numbness or tingling.  Does use a heating pad.   Review of Systems See HPI  HISTORY: Past Medical, Surgical, Social, and Family History Reviewed & Updated per EMR.   Pertinent Historical Findings include:  Past Medical History:  Diagnosis Date  . Anemia   . Anxiety   . Anxiety    uses lexapro, buspar- admits that she has panic attacks - precipitated most by- feeling that she is not in control, work   . Arthritis    knees, elbows - OA?   Marland Kitchen Asthma   . Cancer ([place])    basal cell on her back, removed   . Complication of anesthesia 07/2015    at [facility]-given phenergan for nausea post op & she reports " they ;lost me", told that she was gray & that she was non-responsive.  Given CPAP post op  . Depression   . Dysrhythmia    pt. told that her PCP told her that she has "extra beats" & sent for holter monitor with [facility] [place]. told that it was normal & she never met with a cardiologist    . GERD (gastroesophageal reflux disease)    precipitated by stress   . Headache    pain associated with neck spondylosis   . History of kidney stones    passed spontaneously  . Hypothyroidism   . PONV (postoperative nausea and vomiting)   . Sleep apnea    pt. has self diagnosed - sleep apnea , pt. remarks that insurance doesn't cover the cost of the study so she hasnot had the test  . Thyroid disease     Past Surgical History:  Procedure Laterality Date  . ABDOMINAL EXPLORATION SURGERY  2007  . ABDOMINAL HYSTERECTOMY    . ANTERIOR  CERVICAL DECOMP/DISCECTOMY FUSION N/A 07/30/2016   Procedure: ANTERIOR CERVICAL DECOMPRESSION/DISCECTOMY FUSION, INTERBODY PROSTHESIS, PLATE CERVICAL FIVE-SIX, CERVICAL SIX-SEVEN;  Surgeon: Newman Pies, MD;  Location: [place];  Service: Neurosurgery;  Laterality: N/A;  . BREAST IMPLANT EXCHANGE  2000  . BREAST IMPLANT REMOVAL  1990  . BREAST SURGERY  1990, 2000   Augmentation  . COSMETIC SURGERY    . TONSILLECTOMY    . TONSILLECTOMY AND ADENOIDECTOMY      Allergies  Allergen Reactions  . Azithromycin Nausea Only  . Phenergan [Promethazine Hcl] Other (See Comments)    Depressed respiration, pt. Reports that the nurses told her that she was gray    . Erythromycin Nausea And Vomiting  . Oxycodone Itching  . Tramadol Other (See Comments)    hallucinations    Family History  Problem Relation Age of Onset  . Cancer Father        metastatic skin cancer  . Cancer Sister        breast  . Diabetes Maternal Grandmother   . Cancer Sister        breast  . Cancer Mother        breast cancer     Social  History   Socioeconomic History  . Marital status: Married    Spouse name: Not on file  . Number of children: 0  . Years of education: Not on file  . Highest education level: Not on file  Occupational History  . Occupation: Administrative Asst.  Tobacco Use  . Smoking status: Never Smoker  . Smokeless tobacco: Former Systems developer  . Tobacco comment: electronic cigarettte use in 2012  Substance and Sexual Activity  . Alcohol use: No  . Drug use: No  . Sexual activity: Yes  Other Topics Concern  . Not on file  Social History Narrative   ** Merged History Encounter **       Patient is divorced 01/2012. Lives with a roommate. Education: [employer]. Exercise: Yes.   Social Determinants of Health   Financial Resource Strain:   . Difficulty of Paying Living Expenses: Not on file  Food Insecurity:   . Worried About Charity fundraiser in the Last Year: Not on file  . Ran Out of  Food in the Last Year: Not on file  Transportation Needs:   . Lack of Transportation (Medical): Not on file  . Lack of Transportation (Non-Medical): Not on file  Physical Activity:   . Days of Exercise per Week: Not on file  . Minutes of Exercise per Session: Not on file  Stress:   . Feeling of Stress : Not on file  Social Connections:   . Frequency of Communication with Friends and Family: Not on file  . Frequency of Social Gatherings with Friends and Family: Not on file  . Attends Religious Services: Not on file  . Active Member of Clubs or Organizations: Not on file  . Attends Archivist Meetings: Not on file  . Marital Status: Not on file  Intimate Partner Violence:   . Fear of Current or Ex-Partner: Not on file  . Emotionally Abused: Not on file  . Physically Abused: Not on file  . Sexually Abused: Not on file     PHYSICAL EXAM:  VS: LMP 11/11/2011  Physical Exam Gen: NAD, alert, cooperative with exam, well-appearing ENT: normal lips, normal nasal mucosa,  Eye: normal EOM, normal conjunctiva and lids CV:  no edema,  Neuro: normal tone, normal sensation to touch Psych:  normal insight, alert and oriented MSK:  Neck: Normal range of motion. Normal strength resistance with shrug. No winging of the scapula. Normal shoulder range of motion. Back: Some fullness in the midline lumbar spine. Normal strength with hip flexion. Normal gait. Neurovascularly intact     ASSESSMENT & PLAN:   Acute right-sided low back pain with right-sided sciatica Pain seems more concentrated lower back than radicular at this time.  Does have some swelling along the lumbar midline spine. -Counseled on home exercise therapy and supportive care. -Can continue diclofenac. -Can take Norco as needed. -We will follow-up in a few months.  If no improvement would consider lumbar MRI at that time.  Cervical spondylosis with radiculopathy Symptoms are intermittent and migrating.   Likely related to her stress level at work.  Less radicular today.  More likely myofascial in nature. -Counseled on home exercise therapy and supportive care. -Could consider trigger point injections going forward.  - Could consider physical therapy.

## 2019-08-19 NOTE — Assessment & Plan Note (Signed)
Pain seems more concentrated lower back than radicular at this time.  Does have some swelling along the lumbar midline spine. -Counseled on home exercise therapy and supportive care. -Can continue diclofenac. -Can take Norco as needed. -We will follow-up in a few months.  If no improvement would consider lumbar MRI at that time.

## 2019-08-19 NOTE — Assessment & Plan Note (Signed)
Symptoms are intermittent and migrating.  Likely related to her stress level at work.  Less radicular today.  More likely myofascial in nature. -Counseled on home exercise therapy and supportive care. -Could consider trigger point injections going forward.  - Could consider physical therapy.

## 2019-08-19 NOTE — Assessment & Plan Note (Deleted)
Pain seems more concentrated lower back than radicular at this time.  Does have some swelling along the lumbar midline spine. -Counseled on home exercise therapy and supportive care. -Can continue diclofenac. -Can take Norco as needed. -We will follow-up in a few months.  If no improvement would consider lumbar MRI at that time.

## 2019-09-09 ENCOUNTER — Other Ambulatory Visit: Payer: Self-pay | Admitting: Family Medicine

## 2019-09-09 DIAGNOSIS — M4722 Other spondylosis with radiculopathy, cervical region: Secondary | ICD-10-CM

## 2019-09-09 DIAGNOSIS — M5441 Lumbago with sciatica, right side: Secondary | ICD-10-CM

## 2019-09-10 ENCOUNTER — Other Ambulatory Visit: Payer: Self-pay | Admitting: Family Medicine

## 2019-09-10 DIAGNOSIS — M5441 Lumbago with sciatica, right side: Secondary | ICD-10-CM

## 2019-09-10 DIAGNOSIS — M4722 Other spondylosis with radiculopathy, cervical region: Secondary | ICD-10-CM

## 2019-09-10 MED ORDER — HYDROCODONE-ACETAMINOPHEN 5-325 MG PO TABS: 1.0000 | ORAL_TABLET | Freq: Every evening | ORAL | 0 refills | Status: AC | PRN

## 2019-10-14 ENCOUNTER — Ambulatory Visit: Payer: BLUE CROSS/BLUE SHIELD | Admitting: Family Medicine

## 2022-11-26 ENCOUNTER — Encounter: Payer: Self-pay | Admitting: *Deleted
